# Patient Record
Sex: Male | Born: 1960 | Race: Black or African American | Hispanic: No | Marital: Married | Smoking: Former smoker
Health system: Southern US, Community
[De-identification: ages and names within clinical notes are randomized; demographics above are authoritative.]

## PROBLEM LIST (undated history)

## (undated) DIAGNOSIS — G709 Myoneural disorder, unspecified: Secondary | ICD-10-CM

## (undated) HISTORY — DX: Myoneural disorder, unspecified: G70.9

---

## 1998-11-02 ENCOUNTER — Emergency Department (HOSPITAL_COMMUNITY): Admission: EM | Admit: 1998-11-02 | Discharge: 1998-11-02 | Payer: Self-pay | Admitting: Emergency Medicine

## 2006-02-19 ENCOUNTER — Ambulatory Visit: Payer: Self-pay | Admitting: Endocrinology

## 2006-08-28 ENCOUNTER — Ambulatory Visit: Payer: Self-pay | Admitting: Endocrinology

## 2008-02-05 ENCOUNTER — Ambulatory Visit: Payer: Self-pay | Admitting: Internal Medicine

## 2008-02-05 DIAGNOSIS — M542 Cervicalgia: Secondary | ICD-10-CM

## 2008-02-05 DIAGNOSIS — M79609 Pain in unspecified limb: Secondary | ICD-10-CM

## 2008-02-05 DIAGNOSIS — R5383 Other fatigue: Secondary | ICD-10-CM

## 2008-02-05 DIAGNOSIS — R5381 Other malaise: Secondary | ICD-10-CM

## 2008-02-05 DIAGNOSIS — R209 Unspecified disturbances of skin sensation: Secondary | ICD-10-CM

## 2008-02-05 LAB — CONVERTED CEMR LAB
BUN: 11 mg/dL (ref 6–23)
Chloride: 101 meq/L (ref 96–112)
Eosinophils Absolute: 0.2 10*3/uL (ref 0.0–0.7)
Eosinophils Relative: 4.6 % (ref 0.0–5.0)
GFR calc non Af Amer: 127 mL/min
Glucose, Bld: 111 mg/dL — ABNORMAL HIGH (ref 70–99)
Monocytes Relative: 6.5 % (ref 3.0–12.0)
Neutrophils Relative %: 44.4 % (ref 43.0–77.0)
Platelets: 203 10*3/uL (ref 150–400)
Potassium: 4 meq/L (ref 3.5–5.1)
Sodium: 139 meq/L (ref 135–145)
TSH: 0.52 microintl units/mL (ref 0.35–5.50)
WBC: 3.6 10*3/uL — ABNORMAL LOW (ref 4.5–10.5)

## 2009-01-12 ENCOUNTER — Encounter: Payer: Self-pay | Admitting: Gastroenterology

## 2009-01-31 ENCOUNTER — Ambulatory Visit: Payer: Self-pay | Admitting: Gastroenterology

## 2009-01-31 DIAGNOSIS — R109 Unspecified abdominal pain: Secondary | ICD-10-CM | POA: Insufficient documentation

## 2009-02-01 ENCOUNTER — Ambulatory Visit: Payer: Self-pay | Admitting: Gastroenterology

## 2010-09-05 ENCOUNTER — Other Ambulatory Visit: Payer: Self-pay | Admitting: Family Medicine

## 2010-09-05 DIAGNOSIS — E041 Nontoxic single thyroid nodule: Secondary | ICD-10-CM

## 2011-06-26 ENCOUNTER — Ambulatory Visit (INDEPENDENT_AMBULATORY_CARE_PROVIDER_SITE_OTHER): Payer: 59 | Admitting: Family Medicine

## 2011-06-26 VITALS — BP 124/73 | HR 70 | Temp 97.9°F | Resp 16 | Ht 71.75 in | Wt 191.4 lb

## 2011-06-26 DIAGNOSIS — R200 Anesthesia of skin: Secondary | ICD-10-CM

## 2011-06-26 DIAGNOSIS — M5430 Sciatica, unspecified side: Secondary | ICD-10-CM

## 2011-06-26 DIAGNOSIS — M79609 Pain in unspecified limb: Secondary | ICD-10-CM

## 2011-06-26 DIAGNOSIS — M5432 Sciatica, left side: Secondary | ICD-10-CM

## 2011-06-26 DIAGNOSIS — M79602 Pain in left arm: Secondary | ICD-10-CM

## 2011-06-26 MED ORDER — CYCLOBENZAPRINE HCL 5 MG PO TABS: ORAL_TABLET | ORAL | Status: DC

## 2011-06-26 MED ORDER — PREDNISONE 20 MG PO TABS: ORAL_TABLET | ORAL | Status: DC

## 2011-06-26 MED ORDER — OXAPROZIN 600 MG PO TABS: ORAL_TABLET | ORAL | Status: DC

## 2011-06-26 NOTE — Patient Instructions (Signed)
Take the medicines as ordered. Avoid heavy lifting or straining. If you're not doing better come back in a few weeks for a recheck.

## 2011-06-26 NOTE — Progress Notes (Signed)
Subjective: 75s-year-old [place] male who comes in here complaining of his left leg being numb and painful over the last month. When he stands on it for a while it gets bad. When he just received her rest it does okay. He knows of no injuries. 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 Recent days she's had problem with pain in his left arm and shoulder also. Motor strength is doing good.  Objective: Pleasant gentleman in no acute distress. Cervical motion is good. Shoulders nontender. Arms is grossly normal with good motor strength. Neurovascular intact. Straight leg raising test negative. Good flexion of spine. The tendon reflexes at knee and ankle are normal. Good pedal pulses. Sensory grossly intact.  Assessment: Left lumbar sciatica, intermittent. Left arm pain uncertain etiology  Plan: I do not feel that extensive studies are indicated yet. Will treat with a tapered dose of prednisone and see how you do this, then maintain with some  NSAID. Return if worse. Will give some Flexeril for at bedtime use.

## 2011-08-22 ENCOUNTER — Ambulatory Visit (INDEPENDENT_AMBULATORY_CARE_PROVIDER_SITE_OTHER): Payer: 59 | Admitting: Family Medicine

## 2011-08-22 ENCOUNTER — Ambulatory Visit: Payer: 59

## 2011-08-22 VITALS — BP 126/73 | HR 76 | Temp 98.3°F | Resp 18 | Ht 71.75 in | Wt 195.0 lb

## 2011-08-22 DIAGNOSIS — D649 Anemia, unspecified: Secondary | ICD-10-CM

## 2011-08-22 DIAGNOSIS — R200 Anesthesia of skin: Secondary | ICD-10-CM

## 2011-08-22 DIAGNOSIS — R109 Unspecified abdominal pain: Secondary | ICD-10-CM

## 2011-08-22 DIAGNOSIS — R6881 Early satiety: Secondary | ICD-10-CM

## 2011-08-22 DIAGNOSIS — M549 Dorsalgia, unspecified: Secondary | ICD-10-CM

## 2011-08-22 DIAGNOSIS — R209 Unspecified disturbances of skin sensation: Secondary | ICD-10-CM

## 2011-08-22 LAB — POCT UA - MICROSCOPIC ONLY
Casts, Ur, LPF, POC: NEGATIVE
Crystals, Ur, HPF, POC: NEGATIVE
Mucus, UA: NEGATIVE

## 2011-08-22 LAB — POCT URINALYSIS DIPSTICK
Bilirubin, UA: NEGATIVE
Blood, UA: NEGATIVE
Ketones, UA: NEGATIVE
Leukocytes, UA: NEGATIVE
pH, UA: 7.5

## 2011-08-22 LAB — VITAMIN B12: Vitamin B-12: 319 pg/mL (ref 211–911)

## 2011-08-22 LAB — POCT CBC
Hemoglobin: 11.7 g/dL — AB (ref 14.1–18.1)
Lymph, poc: 1.2 (ref 0.6–3.4)
MCH, POC: 28.8 pg (ref 27–31.2)
MCHC: 33.1 g/dL (ref 31.8–35.4)
MCV: 87.2 fL (ref 80–97)
MPV: 8.8 fL (ref 0–99.8)
POC MID %: 7.3 %M (ref 0–12)
WBC: 2.5 10*3/uL — AB (ref 4.6–10.2)

## 2011-08-22 LAB — COMPREHENSIVE METABOLIC PANEL
ALT: 19 U/L (ref 0–53)
Albumin: 4.6 g/dL (ref 3.5–5.2)
BUN: 16 mg/dL (ref 6–23)
CO2: 29 mEq/L (ref 19–32)
Calcium: 10.1 mg/dL (ref 8.4–10.5)
Chloride: 104 mEq/L (ref 96–112)
Creat: 0.66 mg/dL (ref 0.50–1.35)
Potassium: 4.3 mEq/L (ref 3.5–5.3)

## 2011-08-22 LAB — FOLATE: Folate: 17.2 ng/mL

## 2011-08-22 MED ORDER — DICYCLOMINE HCL 10 MG PO CAPS: ORAL_CAPSULE | ORAL | Status: DC

## 2011-08-22 NOTE — Progress Notes (Signed)
Subjective: he was here 2 months ago with problems with back pain and radiculopathy. We treated him with anti-inflammatory medication, steroids, and muscle accidents. Symptoms have continued to persist. He hurts primarily when he is standing up. It is not bothering him when he is seated. If he stands for about 10 minutes he gets pain as intense as 9/10. The pain radiates from his back to a different places down his legs but usually into the calf with numbness of the foot. No specific injury.  This morning he had abdominal pain. EGD and some fiber beans. He gets feeling gassy and uncomfortable. He is feeling better now. However he gets this from time to time. He takes his bowels move once or twice daily. No problems with them. Sometimes when he eats he gets feeling full very quickly, and having discomfort. He has not been [place] since 2006, and has not seen any parasites in his stools.  Objective: Chest clear to auscultation. Abdomen had normal bowel sounds. Was soft without organomegaly masses or specific tenderness. Normal male external genitalia. No hernias were noted. Digital rectal exam reveals the prostate gland to be moderately large but no nodules. Stool taken for hemasure. Straight leg raising test is negative. Pedal pulse adequate.  Assessment: Back pain with lumbar radiculopathy and foot numbness. Nonspecific abdominal pains  Plan: Check labs Check x-ray of lumbar spine.  Results for orders placed in visit on 08/22/11  POCT CBC      Component Value Range   WBC 2.5 (*) 4.6 - 10.2 (K/uL)   Lymph, poc 1.2  0.6 - 3.4    POC LYMPH PERCENT 49.0  10 - 50 (%L)   MID (cbc) 0.2  0 - 0.9    POC MID % 7.3  0 - 12 (%M)   POC Granulocyte 1.1 (*) 2 - 6.9    Granulocyte percent 43.7  37 - 80 (%G)   RBC 4.06 (*) 4.69 - 6.13 (M/uL)   Hemoglobin 11.7 (*) 14.1 - 18.1 (g/dL)   HCT, POC 09.8 (*) 11.9 - 53.7 (%)   MCV 87.2  80 - 97 (fL)   MCH, POC 28.8  27 - 31.2 (pg)   MCHC 33.1  31.8 - 35.4  (g/dL)   RDW, POC 14.7     Platelet Count, POC 180  142 - 424 (K/uL)   MPV 8.8  0 - 99.8 (fL)  IFOBT (OCCULT BLOOD)      Component Value Range   IFOBT Negative    POCT UA - MICROSCOPIC ONLY      Component Value Range   WBC, Ur, HPF, POC 0-1     RBC, urine, microscopic rare     Bacteria, U Microscopic trace     Mucus, UA neg     Epithelial cells, urine per micros 0-2     Crystals, Ur, HPF, POC neg     Casts, Ur, LPF, POC neg     Yeast, UA neg    POCT URINALYSIS DIPSTICK      Component Value Range   Color, UA yellow     Clarity, UA clear     Glucose, UA neg     Bilirubin, UA neg     Ketones, UA neg     Spec Grav, UA 1.020     Blood, UA neg     pH, UA 7.5     Protein, UA neg     Urobilinogen, UA 0.2     Nitrite, UA neg  Leukocytes, UA Negative    [facility] reading (PRIMARY) by  Dr. Alwyn Ren Normal spine  .

## 2011-08-26 ENCOUNTER — Encounter: Payer: Self-pay | Admitting: Family Medicine

## 2011-08-26 ENCOUNTER — Telehealth: Payer: Self-pay | Admitting: Family Medicine

## 2011-08-26 NOTE — Telephone Encounter (Signed)
LMOM:  MRI okay.  If problems persist we will need to send to a specialist for further eval.  ?neurosurgeon.   I am concerned about his low wbc and rbc.  Asked to return in 1 month for recheck.

## 2011-08-31 ENCOUNTER — Other Ambulatory Visit: Payer: Self-pay

## 2011-08-31 ENCOUNTER — Ambulatory Visit
Admission: RE | Admit: 2011-08-31 | Discharge: 2011-08-31 | Disposition: A | Discharge status: Home / Self Care | Payer: Self-pay | Source: Ambulatory Visit | Attending: Family Medicine | Admitting: Family Medicine

## 2011-08-31 DIAGNOSIS — R200 Anesthesia of skin: Secondary | ICD-10-CM

## 2011-08-31 DIAGNOSIS — M549 Dorsalgia, unspecified: Secondary | ICD-10-CM

## 2011-09-04 ENCOUNTER — Telehealth: Payer: Self-pay | Admitting: Family Medicine

## 2011-09-05 NOTE — Telephone Encounter (Signed)
Pt tried to call Dr Alwyn Ren back, but he was in a room w/a pt. Pt stated that Dr Alwyn Ren can try to reach him back on his cell # [phone], and pt will try to call back as well if Dr Alwyn Ren does not reach him.

## 2011-09-09 ENCOUNTER — Encounter: Payer: Self-pay | Admitting: Family Medicine

## 2011-09-09 ENCOUNTER — Telehealth: Payer: Self-pay | Admitting: Family Medicine

## 2011-09-09 NOTE — Telephone Encounter (Signed)
Left message on machine for patient to return call

## 2011-09-09 NOTE — Telephone Encounter (Signed)
I have been trying to contact his patient by phone to

## 2011-09-09 NOTE — Telephone Encounter (Signed)
I sent him a note today.

## 2011-12-04 ENCOUNTER — Other Ambulatory Visit: Payer: Self-pay | Admitting: Physician Assistant

## 2011-12-04 ENCOUNTER — Ambulatory Visit (INDEPENDENT_AMBULATORY_CARE_PROVIDER_SITE_OTHER): Payer: 59 | Admitting: Physician Assistant

## 2011-12-04 VITALS — BP 130/75 | HR 73 | Temp 97.9°F | Resp 18 | Ht 72.0 in | Wt 187.0 lb

## 2011-12-04 DIAGNOSIS — R519 Headache, unspecified: Secondary | ICD-10-CM

## 2011-12-04 DIAGNOSIS — L03818 Cellulitis of other sites: Secondary | ICD-10-CM

## 2011-12-04 DIAGNOSIS — R51 Headache: Secondary | ICD-10-CM

## 2011-12-04 DIAGNOSIS — L02818 Cutaneous abscess of other sites: Secondary | ICD-10-CM

## 2011-12-04 DIAGNOSIS — L02811 Cutaneous abscess of head [any part, except face]: Secondary | ICD-10-CM

## 2011-12-04 MED ORDER — DOXYCYCLINE HYCLATE 100 MG PO CAPS: 100.0000 mg | ORAL_CAPSULE | Freq: Two times a day (BID) | ORAL | Status: AC

## 2011-12-04 NOTE — Progress Notes (Signed)
  Subjective:    Patient ID: Joshua Kelley, male    DOB: 02-22-1961, 51 y.o.   MRN: [id number]  HPI 51 year old male presents with 10 day history of a bump on the back of his head.  Says he has had several of these that have come up and resolved spontaneously over the last couple weeks.  Admits to some yellow drainage. Denies any warmth. Does have tenderness to touch which is interfering with his sleep due to the location.  No history of similar lesions previous to this.      Review of Systems  All other systems reviewed and are negative.       Objective:   Physical Exam  Constitutional: He is oriented to person, place, and time. He appears well-developed and well-nourished.  HENT:  Head: Normocephalic and atraumatic.  Right Ear: External ear normal.  Left Ear: External ear normal.  Eyes: Conjunctivae are normal.  Neck: Normal range of motion.  Cardiovascular: Normal rate, regular rhythm and normal heart sounds.   Pulmonary/Chest: Effort normal and breath sounds normal.  Neurological: He is alert and oriented to person, place, and time.  Skin:     Psychiatric: He has a normal mood and affect. His behavior is normal. Judgment and thought content normal.     Patient ID: Joshua Kelley MRN: [id number], DOB: [birth year], 51 y.o. Date of Encounter: 12/04/2011, 12:48 PM    PROCEDURE NOTE: Verbal consent obtained. Betadine prep per usual protocol. Local anesthesia obtained with 2% lidocaine with epi.  1 incision made with 11 blade along lesion.  Culture taken. Moderate purulence expressed. Capsule of sebaceous cyst partially debrided.   Lesion explored revealing no loculations. Packed with 1/4 plain packing. Dressed. Wound care instructions including precautions with patient. Patient tolerated the procedure well. Recheck in 24 hours.      Grier Mitts, PA-C 12/04/2011 12:48 PM      Assessment & Plan:   1. Abscess of head  Likely inflamed sebaceous  cyst.  Recheck in 24 hours. Recommend warm compresses. Wound culture, doxycycline (VIBRAMYCIN) 100 MG capsule  2. Pain in head

## 2011-12-05 ENCOUNTER — Ambulatory Visit (INDEPENDENT_AMBULATORY_CARE_PROVIDER_SITE_OTHER): Payer: 59 | Admitting: Physician Assistant

## 2011-12-05 VITALS — BP 133/74 | HR 89 | Temp 98.6°F | Resp 16 | Wt 190.0 lb

## 2011-12-05 DIAGNOSIS — R51 Headache: Secondary | ICD-10-CM

## 2011-12-05 DIAGNOSIS — L02818 Cutaneous abscess of other sites: Secondary | ICD-10-CM

## 2011-12-05 DIAGNOSIS — L089 Local infection of the skin and subcutaneous tissue, unspecified: Secondary | ICD-10-CM

## 2011-12-05 NOTE — Patient Instructions (Addendum)
Continue the antibiotic. Apply a warm compress to the area for 20 minutes 2-3 times daily. Return in 2 days for additional care, but come back tomorrow if the packing falls out again.

## 2011-12-05 NOTE — Progress Notes (Signed)
  Subjective:    Patient ID: MONT JAGODA, male    DOB: 03-17-1961, 51 y.o.   MRN: [id number]  HPI This 51 y.o. male presents for evaluation of an infected sebaceous cyst on the posterior scalp, s/p I&D yesterday.  Tolerating Doxycycline.  No fever. Less painful.  He has applied antibiotic cream to two other lesions that did not require I&D yesterday.  The packing placed in the primary lesion came out when he changed clothes.   Review of Systems No GI/GU symptoms.  No new lesions.    Objective:   Physical Exam  Blood pressure 133/74, pulse 89, temperature 98.6 F (37 C), resp. rate 16, weight 190 lb (86.183 kg). There is no height on file to calculate BMI. Well-developed, well nourished [place] man who is awake, alert and oriented, in NAD. HEENT: [place]/AT Neck: supple, non-tender, no lymphadenopathy, thyromegaly. Lungs: normal effort Skin: Three lesions on the posterior scalp.  The 2 smaller lesions are crusted, and when the crust is removed purulence is expressed.  They are cleansed and antibiotic ointment applied.  The larger lesion, s/p I&D, has a large amount of thick yellow eschar, and what appears to be cyst sac, some of which is debrided.  Remaining material is very adherent to the surrounding tissue.  The wound cavity irrigated with 3 cc 2% lidocaine plain and packed with 1/4 inch packing.  Bandage applied.     Assessment & Plan:   1. Cellulitis and abscess of other specified site   2. Infected sebaceous cyst   3. Headache    Patient Instructions  Continue the antibiotic. Apply a warm compress to the area for 20 minutes 2-3 times daily. Return in 2 days for additional care, but come back tomorrow if the packing falls out again.

## 2011-12-06 ENCOUNTER — Encounter: Payer: Self-pay | Admitting: Physician Assistant

## 2011-12-06 ENCOUNTER — Ambulatory Visit (INDEPENDENT_AMBULATORY_CARE_PROVIDER_SITE_OTHER): Payer: 59 | Admitting: Physician Assistant

## 2011-12-06 VITALS — BP 140/86 | HR 76 | Temp 98.3°F | Resp 16 | Ht 71.5 in | Wt 187.0 lb

## 2011-12-06 DIAGNOSIS — L03818 Cellulitis of other sites: Secondary | ICD-10-CM

## 2011-12-06 DIAGNOSIS — L02818 Cutaneous abscess of other sites: Secondary | ICD-10-CM

## 2011-12-06 DIAGNOSIS — L723 Sebaceous cyst: Secondary | ICD-10-CM

## 2011-12-06 NOTE — Progress Notes (Signed)
  Subjective:    Patient ID: Joshua Kelley, male    DOB: 11-04-1960, 51 y.o.   MRN: [id number]  HPI  This 51 y.o. male presents for evaluation of wounds on the posterior scalp, s/p I&D on 12/04/2011.  He reports considerable decrease in discomfort, and lots of drainage overnight.  There is now a 4th lesion draining.  He is applying antibiotic cream to the 3 lesions not packed.  Tolerating medication.  No fever.  Review of Systems As above.    Objective:   Physical Exam  Blood pressure 140/86, pulse 76, temperature 98.3 F (36.8 C), temperature source Oral, resp. rate 16, height 5' 11.5" (1.816 m), weight 187 lb (84.823 kg). Body mass index is 25.72 kg/(m^2). Well-developed, well nourished [place] man who is awake, alert and oriented, in NAD. Posterior scalp notable for 4 crusted lesions.  The largest, s/p I&D, has packing in place.  Packing removed.  No purulence, but there is remaining cyst sac wall very adherent to the base of the wound.  Small portions of it are removed with pick ups.  The wound was repacked with 1/4 inch plain packing. The three smaller wounds are cleansed, crusts removed, and purulence expressed.  The lesion closest to the largest lesion had some yellow eschar, and when removed, left an opening large enough to pack with a scant amount of packing. The two non-packed lesions are dressed with mupirocin ointment.  All lesions dressed with gauze.     Assessment & Plan:   1. Cellulitis and abscess of other specified site   2. Sebaceous cyst    Patient Instructions  Continue the antibiotic and application of warm compresses to the sites for 20 minutes 2-3 times daily. Return for recheck on Sunday 8a-6p.  If you cannot return on Sunday, return on Monday 8a-8p.

## 2011-12-06 NOTE — Patient Instructions (Signed)
Continue the antibiotic and application of warm compresses to the sites for 20 minutes 2-3 times daily. Return for recheck on Sunday 8a-6p.  If you cannot return on Sunday, return on Monday 8a-8p.

## 2011-12-07 LAB — WOUND CULTURE: Gram Stain: NONE SEEN

## 2011-12-08 ENCOUNTER — Ambulatory Visit (INDEPENDENT_AMBULATORY_CARE_PROVIDER_SITE_OTHER): Payer: 59 | Admitting: Physician Assistant

## 2011-12-08 VITALS — BP 116/73 | HR 80 | Temp 98.0°F | Resp 16 | Ht 72.0 in | Wt 190.0 lb

## 2011-12-08 DIAGNOSIS — L02811 Cutaneous abscess of head [any part, except face]: Secondary | ICD-10-CM

## 2011-12-08 DIAGNOSIS — L02818 Cutaneous abscess of other sites: Secondary | ICD-10-CM

## 2011-12-08 NOTE — Progress Notes (Signed)
Patient ID: Joshua Kelley MRN: [id number], DOB: [birth year] 51 y.o. Date of Encounter: 12/08/2011, 4:51 PM  Chief Complaint: Wound care   See previous note  HPI: 51 y.o. y/o male presents for wound care s/p I&D on 12/04/11. Doing well No issues or complaints Afebrile/ no chills No nausea or vomiting Tolerating doxycycline. Pain controlled.  Daily dressing change Previous note reviewed  No past medical history on file.   Home Meds: Prior to Admission medications   Medication Sig Start Date End Date Taking? Authorizing Provider  doxycycline (VIBRAMYCIN) 100 MG capsule Take 1 capsule (100 mg total) by mouth 2 (two) times daily. 12/04/11 12/14/11 Yes Demi Trieu Jaquita Rector, PA-C  dicyclomine (BENTYL) 10 MG capsule Take one tablet 3 times daily 08/22/11   Peyton Najjar, MD  oxaprozin (DAYPRO) 600 MG tablet Take one twice daily at breakfast and supper for pain and inflammation. 06/26/11   Peyton Najjar, MD  predniSONE (DELTASONE) 20 MG tablet Take 3 daily for 2 days, then 2 daily for 2 days, then one daily for 2 days for sciatica 06/26/11   Peyton Najjar, MD    Allergies: No Known Allergies  ROS: Constitutional: Afebrile, no chills Cardiovascular: negative for chest pain or palpitations Dermatological: Positive for wound. Negative for erythema, pain, or warmth.  GI: No nausea or vomiting   EXAM: Physical Exam: Blood pressure 116/73, pulse 80, temperature 98 F (36.7 C), temperature source Oral, resp. rate 16, height 6' (1.829 m), weight 190 lb (86.183 kg), SpO2 99.00%., Body mass index is 25.77 kg/(m^2). General: Well developed, well nourished, in no acute distress. Nontoxic appearing. Head: Normocephalic, atraumatic, sclera non-icteric.  Neck: Supple. Lungs: Breathing is unlabored. Heart: Normal rate. Skin:  Warm and moist. Dressing and packing in place. No induration, erythema, or tenderness to palpation. Neuro: Alert and oriented X 3. Moves all extremities spontaneously. Normal  gait.  Psych:  Responds to questions appropriately with a normal affect.       PROCEDURE: Dressing and packing removed. No purulence expressed. Minimal purulence on packing and bandage.  Wound bed healthy Irrigated with 2% plain lidocaine 5 cc. Repacked with 1/4" plain. Dressing applied  LAB: Culture:   A/P: 51 y.o. y/o male with scalp cellulitis/abscess as above s/p I&D on 12/04/11 Wound care per above Continue doxycycline. Pain well controlled Daily dressing changes Recheck 48 hours. If unable to return on 8/13, will return on 8/15 on his day off. Will advance packing daily.  Warm compresses to other affected areas on the scalp.   Grier Mitts, PA-C 12/08/2011 4:51 PM

## 2011-12-10 ENCOUNTER — Ambulatory Visit (INDEPENDENT_AMBULATORY_CARE_PROVIDER_SITE_OTHER): Payer: 59 | Admitting: Physician Assistant

## 2011-12-10 VITALS — BP 132/78 | HR 76 | Temp 98.3°F | Resp 18 | Ht 72.0 in | Wt 190.0 lb

## 2011-12-10 DIAGNOSIS — L02818 Cutaneous abscess of other sites: Secondary | ICD-10-CM

## 2011-12-10 DIAGNOSIS — L02811 Cutaneous abscess of head [any part, except face]: Secondary | ICD-10-CM

## 2011-12-10 NOTE — Progress Notes (Signed)
  Subjective:    Patient ID: Joshua Kelley, male    DOB: 10-14-1960, 51 y.o.   MRN: [id number]  HPI Pt. here for abscess recheck. Doing well, much less pain.  Tolerating antibiotics well.  Review of Systems  All other systems reviewed and are negative.       Objective:   Physical Exam  Nursing note and vitals reviewed. Constitutional: He is oriented to person, place, and time. He appears well-developed and well-nourished.  Cardiovascular: Normal rate.   Pulmonary/Chest: Effort normal.  Neurological: He is alert and oriented to person, place, and time.  Skin:       Scalp-packing removed.  Incision site is clean and dry without active draining or erythema..       Assessment & Plan:  Abscess recheck s/p I&D 12/08/2011.  Continue antibiotics and wound care.  Recheck prn

## 2012-01-23 ENCOUNTER — Ambulatory Visit (INDEPENDENT_AMBULATORY_CARE_PROVIDER_SITE_OTHER): Payer: 59 | Admitting: Emergency Medicine

## 2012-01-23 VITALS — BP 124/76 | HR 101 | Temp 98.3°F | Resp 17 | Ht 72.0 in | Wt 194.0 lb

## 2012-01-23 DIAGNOSIS — J40 Bronchitis, not specified as acute or chronic: Secondary | ICD-10-CM

## 2012-01-23 DIAGNOSIS — J018 Other acute sinusitis: Secondary | ICD-10-CM

## 2012-01-23 MED ORDER — PSEUDOEPHEDRINE-GUAIFENESIN ER 60-600 MG PO TB12: 1.0000 | ORAL_TABLET | Freq: Two times a day (BID) | ORAL | Status: AC

## 2012-01-23 MED ORDER — AMOXICILLIN-POT CLAVULANATE 875-125 MG PO TABS: 1.0000 | ORAL_TABLET | Freq: Two times a day (BID) | ORAL | Status: DC

## 2012-01-23 MED ORDER — HYDROCOD POLST-CHLORPHEN POLST 10-8 MG/5ML PO LQCR: 5.0000 mL | Freq: Two times a day (BID) | ORAL | Status: DC | PRN

## 2012-01-23 NOTE — Progress Notes (Signed)
[facility] [address] [place] [address] [phone]  Date:  01/23/2012   Name:  Joshua Kelley   DOB:  02/04/1961   MRN:  [id number]  PCP:  Oliver Barre, MD    Chief Complaint: Cough and Nasal Congestion   History of Present Illness:  Joshua Kelley is a 51 y.o. very pleasant male patient who presents with the following:  Ill for two weeks with green nasal drainage and post nasal drip.  Has a cough that is productive small amount green sputum. No fever or chills.  No shortness of breath or wheezing.  Myalgias, malaise, and fatigue.  Has paroxysms of coughing.  Denies improvement with OTC medication.  Child ill with similar symptoms.    Patient Active Problem List  Diagnosis  . CERVICALGIA  . HEEL PAIN, BILATERAL  . FATIGUE  . PARESTHESIA, HANDS  . ABDOMINAL PAIN OTHER SPECIFIED SITE    No past medical history on file.  No past surgical history on file.  History  Substance Use Topics  . Smoking status: Former Games developer  . Smokeless tobacco: Not on file  . Alcohol Use: Not on file    No family history on file.  No Known Allergies  Medication list has been reviewed and updated.  Current Outpatient Prescriptions on File Prior to Visit  Medication Sig Dispense Refill  . dicyclomine (BENTYL) 10 MG capsule Take one tablet 3 times daily  30 capsule  1  . oxaprozin (DAYPRO) 600 MG tablet Take one twice daily at breakfast and supper for pain and inflammation.  30 tablet  1  . predniSONE (DELTASONE) 20 MG tablet Take 3 daily for 2 days, then 2 daily for 2 days, then one daily for 2 days for sciatica  12 tablet  0    Review of Systems:  As per HPI, otherwise negative.    Physical Examination: Filed Vitals:   01/23/12 1702  BP: 124/76  Pulse: 101  Temp: 98.3 F (36.8 C)  Resp: 17   Filed Vitals:   01/23/12 1702  Height: 6' (1.829 m)  Weight: 194 lb (87.998 kg)   Body mass index is 26.31 kg/(m^2). Ideal Body Weight: Weight  in (lb) to have BMI = 25: 183.9   GEN: WDWN, NAD, Non-toxic, A & O x 3  No rash, sepsis or respiratory distress. HEENT: Atraumatic, Normocephalic. Neck supple. No masses, No LAD.  Oropharynx negative Ears and Nose: No external deformity. Nasal mucosa pallid with green nasal drainage CV: RRR, No M/G/R. No JVD. No thrill. No extra heart sounds. PULM: CTA B, no wheezes, crackles, rhonchi. No retractions. No resp. distress. No accessory muscle use. ABD: S, NT, ND, +BS. No rebound. No HSM. EXTR: No c/c/e NEURO Normal gait.  PSYCH: Normally interactive. Conversant. Not depressed or anxious appearing.  Calm demeanor.    Assessment and Plan: 1. Other acute sinusitis   2. Bronchitis, not specified as acute or chronic    Tussionex augmentin mucinex Follow up as needed. Increase fluids    Carmelina Dane, MD  I have reviewed and agree with documentation. Robert P. Merla Riches, M.D.

## 2012-07-06 ENCOUNTER — Ambulatory Visit: Payer: 59

## 2012-07-08 ENCOUNTER — Ambulatory Visit (INDEPENDENT_AMBULATORY_CARE_PROVIDER_SITE_OTHER): Payer: 59 | Admitting: Emergency Medicine

## 2012-07-08 VITALS — BP 112/80 | HR 79 | Temp 98.4°F | Resp 18 | Ht 70.0 in | Wt 194.0 lb

## 2012-07-08 DIAGNOSIS — L0293 Carbuncle, unspecified: Secondary | ICD-10-CM

## 2012-07-08 DIAGNOSIS — H00019 Hordeolum externum unspecified eye, unspecified eyelid: Secondary | ICD-10-CM

## 2012-07-08 DIAGNOSIS — H00016 Hordeolum externum left eye, unspecified eyelid: Secondary | ICD-10-CM

## 2012-07-08 DIAGNOSIS — L0213 Carbuncle of neck: Secondary | ICD-10-CM

## 2012-07-08 DIAGNOSIS — L0212 Furuncle of neck: Secondary | ICD-10-CM

## 2012-07-08 MED ORDER — MUPIROCIN 2 % EX OINT: TOPICAL_OINTMENT | CUTANEOUS | Status: DC

## 2012-07-08 MED ORDER — DOXYCYCLINE HYCLATE 100 MG PO CAPS: 100.0000 mg | ORAL_CAPSULE | Freq: Two times a day (BID) | ORAL | Status: DC

## 2012-07-08 MED ORDER — TOBRAMYCIN 0.3 % OP OINT: TOPICAL_OINTMENT | Freq: Four times a day (QID) | OPHTHALMIC | Status: DC

## 2012-07-08 NOTE — Progress Notes (Addendum)
  Subjective:    Patient ID: Joshua Kelley, male    DOB: 22-Mar-1961, 52 y.o.   MRN: [id number]  HPI presents today with 1)small spots/bumps on back of head that comes and goes. It is itchy.  2) stye L lower lid    Review of Systems     Objective:   Physical Exam there are multiple raised red bumps over the back of the neck. Examination of left is normal except for a 4 x 5 mm stye present on the lower lid laterally.        Assessment & Plan:  We'll treat with doxycycline for 10 days and also use tobramycin ointment for the sty involving the left lower lid

## 2012-07-11 LAB — WOUND CULTURE
Gram Stain: NONE SEEN
Gram Stain: NONE SEEN

## 2014-04-12 ENCOUNTER — Ambulatory Visit (INDEPENDENT_AMBULATORY_CARE_PROVIDER_SITE_OTHER): Payer: 59 | Admitting: Family Medicine

## 2014-04-12 VITALS — BP 120/78 | HR 69 | Temp 98.6°F | Resp 20 | Ht 71.0 in | Wt 190.1 lb

## 2014-04-12 DIAGNOSIS — R1031 Right lower quadrant pain: Secondary | ICD-10-CM

## 2014-04-12 DIAGNOSIS — M545 Low back pain, unspecified: Secondary | ICD-10-CM

## 2014-04-12 DIAGNOSIS — G8929 Other chronic pain: Secondary | ICD-10-CM

## 2014-04-12 LAB — POCT UA - MICROSCOPIC ONLY
Bacteria, U Microscopic: NEGATIVE
Casts, Ur, LPF, POC: NEGATIVE
Crystals, Ur, HPF, POC: NEGATIVE
Epithelial cells, urine per micros: NEGATIVE
Mucus, UA: NEGATIVE
RBC, urine, microscopic: NEGATIVE
WBC, Ur, HPF, POC: NEGATIVE
Yeast, UA: NEGATIVE

## 2014-04-12 LAB — POCT CBC
Granulocyte percent: 39.4 %G (ref 37–80)
HCT, POC: 41.3 % — AB (ref 43.5–53.7)
Hemoglobin: 13.7 g/dL — AB (ref 14.1–18.1)
Lymph, poc: 1.9 (ref 0.6–3.4)
MCH, POC: 29.1 pg (ref 27–31.2)
MCHC: 33 g/dL (ref 31.8–35.4)
MCV: 88.1 fL (ref 80–97)
MID (cbc): 0.2 (ref 0–0.9)
MPV: 7.2 fL (ref 0–99.8)
POC Granulocyte: 1.3 — AB (ref 2–6.9)
POC LYMPH PERCENT: 56.1 %L — AB (ref 10–50)
POC MID %: 4.5 %M (ref 0–12)
Platelet Count, POC: 208 10*3/uL (ref 142–424)
RBC: 4.69 M/uL (ref 4.69–6.13)
RDW, POC: 12.8 %
WBC: 3.4 10*3/uL — AB (ref 4.6–10.2)

## 2014-04-12 LAB — POCT URINALYSIS DIPSTICK
Bilirubin, UA: NEGATIVE
Blood, UA: NEGATIVE
Glucose, UA: NEGATIVE
Ketones, UA: NEGATIVE
Leukocytes, UA: NEGATIVE
Nitrite, UA: NEGATIVE
Protein, UA: NEGATIVE
Spec Grav, UA: 1.025
Urobilinogen, UA: 0.2
pH, UA: 5

## 2014-04-12 MED ORDER — DICLOFENAC SODIUM 75 MG PO TBEC: 75.0000 mg | DELAYED_RELEASE_TABLET | Freq: Two times a day (BID) | ORAL | Status: DC

## 2014-04-12 MED ORDER — DOXYCYCLINE HYCLATE 100 MG PO TABS: 100.0000 mg | ORAL_TABLET | Freq: Two times a day (BID) | ORAL | Status: DC

## 2014-04-12 NOTE — Progress Notes (Signed)
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 He gives a rather vague history of intermittent but persistent bilateral lumbar pain with some radiation to right lower quadrant of his abdomen. He says that he gets a little bit nauseated when he eats.  He also notes that he's been going to bathroom more frequently but has no burning or fever.  Patient also denies vomiting   objective: No acute distress HEENT: Unremarkable Neck: Supple no adenopathy and full range of motion Chest: Clear Abdomen: Soft nontender without HSM or masses Patient has full range of motion of his hips, knees, ankles. Straight leg raising is negative. His pelvic rotation is number normal. Is no loss of muscle mass. Coordination is normal and lower extremities.  Results for orders placed or performed in visit on 04/12/14  POCT urinalysis dipstick  Result Value Ref Range   Color, UA yellow    Clarity, UA clear    Glucose, UA neg    Bilirubin, UA neg    Ketones, UA neg    Spec Grav, UA 1.025    Blood, UA neg    pH, UA 5.0    Protein, UA neg    Urobilinogen, UA 0.2    Nitrite, UA neg    Leukocytes, UA Negative   POCT UA - Microscopic Only  Result Value Ref Range   WBC, Ur, HPF, POC neg    RBC, urine, microscopic neg    Bacteria, U Microscopic neg    Mucus, UA neg    Epithelial cells, urine per micros neg    Crystals, Ur, HPF, POC neg    Casts, Ur, LPF, POC neg    Yeast, UA neg   POCT CBC  Result Value Ref Range   WBC 3.4 (A) 4.6 - 10.2 K/uL   Lymph, poc 1.9 0.6 - 3.4   POC LYMPH PERCENT 56.1 (A) 10 - 50 %L   MID (cbc) 0.2 0 - 0.9   POC MID % 4.5 0 - 12 %M   POC Granulocyte 1.3 (A) 2 - 6.9   Granulocyte percent 39.4 37 - 80 %G   RBC 4.69 4.69 - 6.13 M/uL   Hemoglobin 13.7 (A) 14.1 - 18.1 g/dL   HCT, POC 16.141.3 (A) 09.643.5 - 53.7 %   MCV 88.1 80 - 97 fL   MCH, POC 29.1 27 - 31.2 pg   MCHC 33.0 31.8 - 35.4 g/dL   RDW, POC 04.512.8 %   Platelet Count, POC 208 142 - 424 K/uL   MPV 7.2 0 - 99.8 fL    Assessment:  Symptoms are consistent with either prostate infection, viral infection, or simple low back pain.  Plan: The diclofenac and doxycycline     ICD-9-CM ICD-10-CM   1. Bilateral low back pain without sciatica 724.2 M54.5 POCT urinalysis dipstick     POCT UA - Microscopic Only     Comprehensive metabolic panel     POCT CBC     diclofenac (VOLTAREN) 75 MG EC tablet     doxycycline (VIBRA-TABS) 100 MG tablet  2. Abdominal pain, chronic, right lower quadrant 789.03 R10.31 POCT urinalysis dipstick   338.29 G89.29 POCT UA - Microscopic Only     Comprehensive metabolic panel     POCT CBC     diclofenac (VOLTAREN) 75 MG EC tablet     doxycycline (VIBRA-TABS) 100 MG tablet   Bilateral low back pain without sciatica - Plan: POCT urinalysis dipstick, POCT UA - Microscopic Only, Comprehensive metabolic panel, POCT CBC, diclofenac (  VOLTAREN) 75 MG EC tablet, doxycycline (VIBRA-TABS) 100 MG tablet  Abdominal pain, chronic, right lower quadrant - Plan: POCT urinalysis dipstick, POCT UA - Microscopic Only, Comprehensive metabolic panel, POCT CBC, diclofenac (VOLTAREN) 75 MG EC tablet, doxycycline (VIBRA-TABS) 100 MG tablet    Signed, Elvina SidleKurt Ayodeji Keimig, MD .

## 2014-04-13 LAB — COMPREHENSIVE METABOLIC PANEL WITH GFR
ALT: 15 U/L (ref 0–53)
AST: 17 U/L (ref 0–37)
Albumin: 4.5 g/dL (ref 3.5–5.2)
Alkaline Phosphatase: 101 U/L (ref 39–117)
BUN: 13 mg/dL (ref 6–23)
CO2: 32 meq/L (ref 19–32)
Calcium: 9.6 mg/dL (ref 8.4–10.5)
Chloride: 100 meq/L (ref 96–112)
Creat: 0.86 mg/dL (ref 0.50–1.35)
Glucose, Bld: 103 mg/dL — ABNORMAL HIGH (ref 70–99)
Potassium: 4.2 meq/L (ref 3.5–5.3)
Sodium: 138 meq/L (ref 135–145)
Total Bilirubin: 0.4 mg/dL (ref 0.2–1.2)
Total Protein: 6.9 g/dL (ref 6.0–8.3)

## 2014-05-19 ENCOUNTER — Ambulatory Visit (INDEPENDENT_AMBULATORY_CARE_PROVIDER_SITE_OTHER): Payer: 59 | Admitting: Emergency Medicine

## 2014-05-19 VITALS — BP 128/88 | HR 72 | Temp 98.4°F | Resp 18 | Wt 197.0 lb

## 2014-05-19 DIAGNOSIS — L0211 Cutaneous abscess of neck: Secondary | ICD-10-CM

## 2014-05-19 MED ORDER — SULFAMETHOXAZOLE-TRIMETHOPRIM 800-160 MG PO TABS: 1.0000 | ORAL_TABLET | Freq: Two times a day (BID) | ORAL | Status: DC

## 2014-05-19 NOTE — Progress Notes (Signed)
[facility] [address], [place] [id number] [phone]  Date:  05/19/2014   Name:  Joshua Kelley   DOB:  11/11/1960   MRN:  [id number]  PCP:  Oliver BarreJames John, MD    Chief Complaint: "bump under chin"   History of Present Illness:  Joshua Kelley is a 54 y.o. 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 Some purulent drainage No fever or chills History of similar. No improvement with over the counter medications or other home remedies.  Denies other complaint or health concern today.   Patient Active Problem List   Diagnosis Date Noted  . ABDOMINAL PAIN OTHER SPECIFIED SITE 01/31/2009  . CERVICALGIA 02/05/2008  . HEEL PAIN, BILATERAL 02/05/2008  . FATIGUE 02/05/2008  . PARESTHESIA, HANDS 02/05/2008    Past Medical History  Diagnosis Date  . Neuromuscular disorder     No past surgical history on file.  History  Substance Use Topics  . Smoking status: Former Games developermoker  . Smokeless tobacco: Never Used  . Alcohol Use: No    Family History  Problem Relation Age of Onset  . Diabetes Father     No Known Allergies  Medication list has been reviewed and updated.  No current outpatient prescriptions on file prior to visit.   No current facility-administered medications on file prior to visit.    Review of Systems:  As per HPI, otherwise negative.    Physical Examination: Filed Vitals:   05/19/14 1819  BP: 128/88  Pulse: 72  Temp: 98.4 F (36.9 C)  Resp: 18   Filed Vitals:   05/19/14 1819  Weight: 197 lb (89.359 kg)   Body mass index is 27.49 kg/(m^2). Ideal Body Weight:     GEN: WDWN, NAD, Non-toxic, Alert & Oriented x 3 HEENT: Atraumatic, Normocephalic.  Ears and Nose: No external deformity. EXTR: No clubbing/cyanosis/edema NEURO: Normal gait.  PSYCH: Normally interactive. Conversant. Not depressed or anxious appearing.  Calm demeanor.  Marble  sized draining cutaneous mass. No cellulitis  Assessment and Plan: Abscess Septra  Signed,  Phillips OdorJeffery Jaquann Guarisco, MD

## 2014-05-19 NOTE — Patient Instructions (Signed)

## 2015-04-03 ENCOUNTER — Ambulatory Visit (INDEPENDENT_AMBULATORY_CARE_PROVIDER_SITE_OTHER): Payer: BLUE CROSS/BLUE SHIELD

## 2015-04-03 ENCOUNTER — Ambulatory Visit (INDEPENDENT_AMBULATORY_CARE_PROVIDER_SITE_OTHER): Payer: BLUE CROSS/BLUE SHIELD | Admitting: Emergency Medicine

## 2015-04-03 VITALS — BP 110/82 | HR 79 | Temp 98.4°F | Resp 18 | Ht 71.0 in | Wt 191.0 lb

## 2015-04-03 DIAGNOSIS — M25512 Pain in left shoulder: Secondary | ICD-10-CM

## 2015-04-03 DIAGNOSIS — M5410 Radiculopathy, site unspecified: Secondary | ICD-10-CM | POA: Diagnosis not present

## 2015-04-03 DIAGNOSIS — R079 Chest pain, unspecified: Secondary | ICD-10-CM

## 2015-04-03 MED ORDER — GABAPENTIN 300 MG PO CAPS: ORAL_CAPSULE | ORAL | Status: DC

## 2015-04-03 MED ORDER — PREDNISONE 10 MG PO TABS: ORAL_TABLET | ORAL | Status: DC

## 2015-04-03 NOTE — Progress Notes (Signed)
This chart was scribed for Lesle Chris, MD by Joshua Ore, Medical Scribe. This patient was seen in Room 5 and the patient's care was started 8:51 AM.  Chief Complaint:  Chief Complaint  Patient presents with  . Back Pain    upper back pain that spreads to the arms. x10 days No known injuries.     HPI: Joshua Kelley is a 54 y.o. male who reports to [facility] today complaining of gradual onset upper back pain that has radiated down to his left arm that started 10 days ago. He states that he first noticed it when it was cold outside and heat was on high inside. He's tried rubbing a balm on his arms without resolve. He denies any known injuries to the area. He denies neck stiffness, neck pain.   He drives a truck for work.   Past Medical History  Diagnosis Date  . Neuromuscular disorder (HCC)    History reviewed. No pertinent past surgical history. Social History   Social History  . Marital Status: Married    Spouse Name: N/A  . Number of Children: N/A  . Years of Education: N/A   Social History Main Topics  . Smoking status: Former Games developer  . Smokeless tobacco: Never Used  . Alcohol Use: No  . Drug Use: No  . Sexual Activity: Not Asked   Other Topics Concern  . None   Social History Narrative   Family History  Problem Relation Age of Onset  . Diabetes Father    No Known Allergies Prior to Admission medications   Medication Sig Start Date End Date Taking? Authorizing Provider  sulfamethoxazole-trimethoprim (BACTRIM DS,SEPTRA DS) 800-160 MG per tablet Take 1 tablet by mouth 2 (two) times daily. Patient not taking: Reported on 04/03/2015 05/19/14   Carmelina Dane, MD     ROS:  Constitutional: negative for fever, chills, night sweats, weight changes, or fatigue  HEENT: negative for vision changes, hearing loss, congestion, rhinorrhea, ST, epistaxis, or sinus pressure Cardiovascular: negative for chest pain or palpitations Respiratory: negative for  hemoptysis, wheezing, shortness of breath, or cough Abdominal: negative for abdominal pain, nausea, vomiting, diarrhea, or constipation Dermatological: negative for rash Neurologic: negative for headache, dizziness, or syncope Musc: positive for back pain, myalgia (left arm)  negative for neck pain, neck stiffness All other systems reviewed and are otherwise negative with the exception to those above and in the HPI.  PHYSICAL EXAM: Filed Vitals:   04/03/15 0846  BP: 110/82  Pulse: 79  Temp: 98.4 F (36.9 C)  Resp: 18   Body mass index is 26.65 kg/(m^2).   General: Alert and cooperative HEENT:  Normocephalic, atraumatic, oropharynx patent. Eye: EOMI, [facility] Neck: thyroid nl, good ROM neck Cardiovascular:  Regular rate and rhythm, no rubs murmurs or gallops.  No Carotid bruits, radial pulse intact. No pedal edema.  Respiratory: Clear to auscultation bilaterally.  No wheezes, rales, or rhonchi.  No cyanosis, no use of accessory musculature Abdominal: No organomegaly, abdomen is soft and non-tender, positive bowel sounds. No masses. Musculoskeletal: Gait intact. tenderness along the left medial scapular border and left posterior chest Skin: No rashes. Neurologic: Facial musculature symmetric.; 2+ biceps but trace to no brachioradialis reflexes Psychiatric: Patient acts appropriately throughout our interaction.  Lymphatic: No cervical or submandibular lymphadenopathy Genitourinary/Anorectal: No acute findings   LABS:   EKG/XRAY:   Primary read interpreted by Dr. Cleta Alberts at [facility]: chest xray: no acute disease, no pneumothorax, chest normal c-spine xray: mild degenerative  changes at c4 and c5, mild foraminal narrowing on the left at c5; please comment on calcification at AP view at C3 Left shoulder xray: normal   ASSESSMENT/PLAN: There was an unusual calcific density that appear benign adjacent to C3. Awaiting radiologist review this. This was on the right. We'll proceed with  treatment with a tapered dose of prednisone along with Neurontin at night. 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 Recheck 1 week if symptoms persist.  By signing my name below, I, Joshua Kelley, attest that this documentation has been prepared under the direction and in the presence of Lesle ChrisSteven Brihanna Devenport, MD. Electronically Signed: Stann Oresung-Kai Kelley, Scribe. 04/03/2015 , 8:51 AM .    Michaell CowingGross sideeffects, risk and benefits, and alternatives of medications d/w patient. Patient is aware that all medications have potential sideeffects and we are unable to predict every sideeffect or drug-drug interaction that may occur.  Lesle ChrisSteven Royelle Hinchman MD 04/03/2015 8:51 AM

## 2015-04-17 ENCOUNTER — Ambulatory Visit (INDEPENDENT_AMBULATORY_CARE_PROVIDER_SITE_OTHER): Payer: BLUE CROSS/BLUE SHIELD | Admitting: Emergency Medicine

## 2015-04-17 VITALS — BP 130/82 | HR 81 | Temp 98.8°F | Resp 18 | Ht 71.0 in | Wt 193.0 lb

## 2015-04-17 DIAGNOSIS — M25512 Pain in left shoulder: Secondary | ICD-10-CM

## 2015-04-17 DIAGNOSIS — R079 Chest pain, unspecified: Secondary | ICD-10-CM | POA: Diagnosis not present

## 2015-04-17 DIAGNOSIS — M5410 Radiculopathy, site unspecified: Secondary | ICD-10-CM | POA: Diagnosis not present

## 2015-04-17 MED ORDER — GABAPENTIN 300 MG PO CAPS: ORAL_CAPSULE | ORAL | Status: AC

## 2015-04-17 MED ORDER — MELOXICAM 15 MG PO TABS: 15.0000 mg | ORAL_TABLET | Freq: Every day | ORAL | Status: DC

## 2015-04-17 NOTE — Progress Notes (Signed)
Patient ID: Joshua BorosSherefeldein M Standing, male   DOB: 11/23/1960, 54 y.o.   MRN: [id number]    By signing my name below, I, Essence Howell, attest that this documentation has been prepared under the direction and in the presence of Collene GobbleSteven A Mayrin Schmuck, MD Electronically Signed: Charline BillsEssence Howell, ED Scribe 04/17/2015 at 9:07 AM.  Chief Complaint:  Chief Complaint  Patient presents with  . Follow-up    no better  . Shoulder Pain  . Elbow Pain  . Chest Pain    left side of chest    HPI: Joshua Kelley is a 54 y.o. male who [facility] for a follow-up regarding persistent upper back pain. Pt was seen in the office on 04/03/15 regarding upper back pain that radiates into left arm x10 days prior. 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 Pt was discharged with Prednisone and Neurontin which he states has provided minimal relief for 1 week but states that pt has returned. Pt reports persistent pain that radiates from his left shoulder into left upper arm that is exacerbated with movement. Pain is not reproducible with palpation or turning his neck. 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 Pt denies chest pain and cough.  Past Medical History  Diagnosis Date  . Neuromuscular disorder (HCC)    History reviewed. No pertinent past surgical history. Social History   Social History  . Marital Status: Married    Spouse Name: N/A  . Number of Children: N/A  . Years of Education: N/A   Social History Main Topics  . Smoking status: Former Games developermoker  . Smokeless tobacco: Never Used  . Alcohol Use: No  . Drug Use: No  . Sexual Activity: Not Asked   Other Topics Concern  . None   Social History Narrative   Family History  Problem Relation Age of Onset  . Diabetes Father    No Known Allergies Prior to Admission medications   Medication Sig Start Date End Date Taking? Authorizing Provider  gabapentin (NEURONTIN) 300 MG capsule Take 1  tablet at bedtime 04/03/15  Yes Collene GobbleSteven A Conchita Truxillo, MD  predniSONE (DELTASONE) 10 MG tablet Take 4 a day for 3 days 3 a day for 3 days 2 a day for 3 days one a day for 3 days Patient not taking: Reported on 04/17/2015 04/03/15   Collene GobbleSteven A Meryl Ponder, MD  sulfamethoxazole-trimethoprim (BACTRIM DS,SEPTRA DS) 800-160 MG per tablet Take 1 tablet by mouth 2 (two) times daily. Patient not taking: Reported on 04/03/2015 05/19/14   Carmelina DaneJeffery S Anderson, MD   ROS: The patient denies fevers, chills, night sweats, unintentional weight loss, cough, chest pain, palpitations, wheezing, dyspnea on exertion, nausea, vomiting, abdominal pain, dysuria, hematuria, melena, numbness, weakness, or tingling. +myalgiaas   All other systems have been reviewed and were otherwise negative with the exception of those mentioned in the HPI and as above.    PHYSICAL EXAM: Filed Vitals:   04/17/15 0836  BP: 130/82  Pulse: 81  Temp: 98.8 F (37.1 C)  Resp: 18   Body mass index is 26.93 kg/(m^2).   General: Alert, no acute distress HEENT:  Normocephalic, atraumatic, oropharynx patent. [facility] Cardiovascular:  Regular rate and rhythm, no rubs murmurs or gallops. No Carotid bruits, radial pulse intact. No pedal edema.  Respiratory: Clear to auscultation bilaterally. No wheezes, rales, or rhonchi. No cyanosis, no use of accessory musculature Abdominal: No  organomegaly, abdomen is soft and non-tender, positive bowel sounds. No masses. Musculoskeletal: Gait intact. No edema. Minimal discomfort L parascapular area. Biceps tendon 2+. Absent brachial radial L arm. Triceps 2+. No focal weakness.  Skin: No rashes. Neurologic: Facial musculature symmetric. Psychiatric: Patient acts appropriately throughout our interaction. Lymphatic: No cervical or submandibular lymphadenopathy  LABS:  EKG/XRAY:   Primary read interpreted by Dr. Cleta Alberts at [facility]. EKG shows J-point elevation V2 no acute changes are  seen.  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 He does not have weakness but does have an absent brachial radialis reflex. We'll proceed with MRI. I did not want to put him back on prednisone so started motivate 15 mg 1 a day. He will be on Neurontin 300 mg 1-2 at bedtime and if not working can take one during the day. Will follow-up after his MRI.Michaell Cowing sideeffects, risk and benefits, and alternatives of medications d/w patient. Patient is aware that all medications have potential sideeffects and we are unable to predict every sideeffect or drug-drug interaction that may occur.  Lesle Chris MD 04/17/2015 8:45 AM    some

## 2015-04-17 NOTE — Patient Instructions (Addendum)
I am going to schedule yuou for an MRI of your neck. I had increased your medications back and shoulder discomfort. .Cervical Radiculopathy Cervical radiculopathy happens when a nerve in the neck (cervical nerve) is pinched or bruised. This condition can develop because of an injury or as part of the normal aging process. Pressure on the cervical nerves can cause pain or numbness that runs from the neck all the way down into the arm and fingers. Usually, this condition gets better with rest. Treatment may be needed if the condition does not improve.  CAUSES This condition may be caused by:  Injury.  Slipped (herniated) disk.  Muscle tightness in the neck because of overuse.  Arthritis.  Breakdown or degeneration in the bones and joints of the spine (spondylosis) due to aging.  Bone spurs that may develop near the cervical nerves. SYMPTOMS Symptoms of this condition include:  Pain that runs from the neck to the arm and hand. The pain can be severe or irritating. It may be worse when the neck is moved.  Numbness or weakness in the affected arm and hand. DIAGNOSIS This condition may be diagnosed based on symptoms, medical history, and a physical exam. You may also have tests, including:  X-rays.  CT scan.  MRI.  Electromyogram (EMG).  Nerve conduction tests. TREATMENT In many cases, treatment is not needed for this condition. With rest, the condition usually gets better over time. If treatment is needed, options may include:  Wearing a soft neck collar for short periods of time.  Physical therapy to strengthen your neck muscles.  Medicines, such as NSAIDs, oral corticosteroids, or spinal injections.  Surgery. This may be needed if other treatments do not help. Various types of surgery may be done depending on the cause of your problems. HOME CARE INSTRUCTIONS Managing Pain  Take over-the-counter and prescription medicines only as told by your health care  provider.  If directed, apply ice to the affected area.  Put ice in a plastic bag.  Place a towel between your skin and the bag.  Leave the ice on for 20 minutes, 2-3 times per day.  If ice does not help, you can try using heat. Take a warm shower or warm bath, or use a heat pack as told by your health care provider.  Try a gentle neck and shoulder massage to help relieve symptoms. Activity  Rest as needed. Follow instructions from your health care provider about any restrictions on activities.  Do stretching and strengthening exercises as told by your health care provider or physical therapist. General Instructions  If you were given a soft collar, wear it as told by your health care provider.  Use a flat pillow when you sleep.  Keep all follow-up visits as told by your health care provider. This is important. SEEK MEDICAL CARE IF:  Your condition does not improve with treatment. SEEK IMMEDIATE MEDICAL CARE IF:  Your pain gets much worse and cannot be controlled with medicines.  You have weakness or numbness in your hand, arm, face, or leg.  You have a high fever.  You have a stiff, rigid neck.  You lose control of your bowels or your bladder (have incontinence).  You have trouble with walking, balance, or speaking.   This information is not intended to replace advice given to you by your health care provider. Make sure you discuss any questions you have with your health care provider.   Document Released: 01/08/2001 Document Revised: 01/04/2015 Document  Reviewed: 06/09/2014 Elsevier Interactive Patient Education [employer].

## 2015-09-01 ENCOUNTER — Encounter: Payer: Self-pay | Admitting: Gastroenterology

## 2017-11-20 ENCOUNTER — Ambulatory Visit (INDEPENDENT_AMBULATORY_CARE_PROVIDER_SITE_OTHER): Payer: BLUE CROSS/BLUE SHIELD

## 2017-11-20 ENCOUNTER — Ambulatory Visit (INDEPENDENT_AMBULATORY_CARE_PROVIDER_SITE_OTHER): Payer: BLUE CROSS/BLUE SHIELD | Admitting: Physician Assistant

## 2017-11-20 ENCOUNTER — Encounter: Payer: Self-pay | Admitting: Physician Assistant

## 2017-11-20 ENCOUNTER — Other Ambulatory Visit: Payer: Self-pay

## 2017-11-20 VITALS — BP 145/85 | HR 64 | Temp 97.8°F | Wt 180.2 lb

## 2017-11-20 DIAGNOSIS — M545 Low back pain: Secondary | ICD-10-CM | POA: Diagnosis not present

## 2017-11-20 DIAGNOSIS — K5900 Constipation, unspecified: Secondary | ICD-10-CM | POA: Diagnosis not present

## 2017-11-20 DIAGNOSIS — R300 Dysuria: Secondary | ICD-10-CM | POA: Diagnosis not present

## 2017-11-20 LAB — POCT URINALYSIS DIP (MANUAL ENTRY)
Bilirubin, UA: NEGATIVE
Blood, UA: NEGATIVE
Glucose, UA: NEGATIVE mg/dL
Ketones, POC UA: NEGATIVE mg/dL
Leukocytes, UA: NEGATIVE
Nitrite, UA: NEGATIVE
Protein Ur, POC: NEGATIVE mg/dL
Spec Grav, UA: 1.02 (ref 1.010–1.025)
Urobilinogen, UA: 0.2 U/dL
pH, UA: 8.5 — AB (ref 5.0–8.0)

## 2017-11-20 LAB — POCT GLYCOSYLATED HEMOGLOBIN (HGB A1C): Hemoglobin A1C: 6.3 % — AB (ref 4.0–5.6)

## 2017-11-20 LAB — POC MICROSCOPIC URINALYSIS (UMFC): Mucus: ABSENT

## 2017-11-20 MED ORDER — MELOXICAM 15 MG PO TABS: 15.0000 mg | ORAL_TABLET | Freq: Every day | ORAL | 1 refills | Status: AC

## 2017-11-20 MED ORDER — POLYETHYLENE GLYCOL 3350 17 GM/SCOOP PO POWD: 17.0000 g | Freq: Two times a day (BID) | ORAL | 1 refills | Status: AC | PRN

## 2017-11-20 NOTE — Patient Instructions (Addendum)
1) you have pre-diabetes (see below) 2) you are constipated, which can contribute to your lower back pain (see below).  3) be sure you are drinking at least 64 oz water/daily.  4) for your back pain. Take Meloxicam 15mg /daily as needed. Meloxicam is an NSAID. Do not use with any other otc pain medication other than tylenol/acetaminophen - so no aleve, ibuprofen, motrin, advil, etc.   For constipation  Look up "6 Reasons to Care about Poop Health" at [employer] (http://www.edwards.info/www.precisionnutrition.com/blog/infographics)  1) Water: Make sure you are drinking enough water daily - about 1-3 liters. 2) Fiber: Make sure you are getting enough fiber in your diet - this will make you regular - you can eat high fiber foods or use metamucil as a supplement - it is really important to drink enough water when using fiber supplements. Foods that have a lot of fiber include vegetables, fruits, beans, nuts, oatmeal, and some breads and cereals. You can tell how much fiber is in a food by reading the nutrition label. Doctors recommend eating 25 to 36 grams of fiber each day. 3) Fitness: Increasing your physical activity will help increase the natural movement of your bowels. Try to get 20-30 minutes of exercise daily.  4) Use a 9" stool in front of your toilet to rest your feet on while you have a bowel movement. This will loosen your rectal muscles to help your stool come out easier and prevent straining.   For gentle treatment of constipation: Use Miralax 1-2 capfuls a day until your stools are soft and regular and then decrease the usage - you can use this daily  -------------------------------------------------------------------------------------------------------------------------------------------------------------- Preventing Type 2 Diabetes Mellitus Type 2 diabetes (type 2 diabetes mellitus) is a long-term (chronic) disease that affects blood sugar (glucose) levels. Normally, a hormone called insulin allows  glucose to enter cells in the body. The cells use glucose for energy. In type 2 diabetes, one or both of these problems may be present:  The body does not make enough insulin.  The body does not respond properly to insulin that it makes (insulin resistance).  Insulin resistance or lack of insulin causes excess glucose to build up in the blood instead of going into cells. As a result, high blood glucose (hyperglycemia) develops, which can cause many complications. Being overweight or obese and having an inactive (sedentary) lifestyle can increase your risk for diabetes. Type 2 diabetes can be delayed or prevented by making certain nutrition and lifestyle changes. What nutrition changes can be made?  Eat healthy meals and snacks regularly. Keep a healthy snack with you for when you get hungry between meals, such as fruit or a handful of nuts.  Eat lean meats and proteins that are low in saturated fats, such as chicken, fish, egg whites, and beans. Avoid processed meats.  Eat plenty of fruits and vegetables and plenty of grains that have not been processed (whole grains). It is recommended that you eat: ? 1?2 cups of fruit every day. ? 2?3 cups of vegetables every day. ? 6?8 oz of whole grains every day, such as oats, whole wheat, bulgur, brown rice, quinoa, and millet.  Eat low-fat dairy products, such as milk, yogurt, and cheese.  Eat foods that contain healthy fats, such as nuts, avocado, olive oil, and canola oil.  Drink water throughout the day. Avoid drinks that contain added sugar, such as soda or sweet tea.  Follow instructions from your health care provider about specific eating or drinking restrictions.  Control  how much food you eat at a time (portion size). ? Check food labels to find out the serving sizes of foods. ? Use a kitchen scale to weigh amounts of foods.  Saute or steam food instead of frying it. Cook with water or broth instead of oils or butter.  Limit your  intake of: ? Salt (sodium). Have no more than 1 tsp (2,400 mg) of sodium a day. If you have heart disease or high blood pressure, have less than ? tsp (1,500 mg) of sodium a day. ? Saturated fat. This is fat that is solid at room temperature, such as butter or fat on meat. What lifestyle changes can be made?  Activity  Do moderate-intensity physical activity for at least 30 minutes on at least 5 days of the week, or as much as told by your health care provider.  Ask your health care provider what activities are safe for you. A mix of physical activities may be best, such as walking, swimming, cycling, and strength training.  Try to add physical activity into your day. For example: ? Park in spots that are farther away than usual, so that you walk more. For example, park in a far corner of the parking lot when you go to the office or the grocery store. ? Take a walk during your lunch break. ? Use stairs instead of elevators or escalators. Weight Loss  Lose weight as directed. Your health care provider can determine how much weight loss is best for you and can help you lose weight safely.  If you are overweight or obese, you may be instructed to lose at least 5?7 % of your body weight. Alcohol and Tobacco   Limit alcohol intake to no more than 1 drink a day for nonpregnant women and 2 drinks a day for men. One drink equals 12 oz of beer, 5 oz of wine, or 1 oz of hard liquor.  Do not use any tobacco products, such as cigarettes, chewing tobacco, and e-cigarettes. If you need help quitting, ask your health care provider. Work With Your Health Care Provider  Have your blood glucose tested regularly, as told by your health care provider.  Discuss your risk factors and how you can reduce your risk for diabetes.  Get screening tests as told by your health care provider. You may have screening tests regularly, especially if you have certain risk factors for type 2 diabetes.  Make an  appointment with a diet and nutrition specialist (registered dietitian). A registered dietitian can help you make a healthy eating plan and can help you understand portion sizes and food labels. Why are these changes important?  It is possible to prevent or delay type 2 diabetes and related health problems by making lifestyle and nutrition changes.  It can be difficult to recognize signs of type 2 diabetes. The best way to avoid possible damage to your body is to take actions to prevent the disease before you develop symptoms. What can happen if changes are not made?  Your blood glucose levels may keep increasing. Having high blood glucose for a long time is dangerous. Too much glucose in your blood can damage your blood vessels, heart, kidneys, nerves, and eyes.  You may develop prediabetes or type 2 diabetes. Type 2 diabetes can lead to many chronic health problems and complications, such as: ? Heart disease. ? Stroke. ? Blindness. ? Kidney disease. ? Depression. ? Poor circulation in the feet and legs, which could lead to surgical  removal (amputation) in severe cases. Where to find support:  Ask your health care provider to recommend a registered dietitian, diabetes educator, or weight loss program.  Look for local or online weight loss groups.  Join a gym, fitness club, or outdoor activity group, such as a walking club. Where to find more information: To learn more about diabetes and diabetes prevention, visit:  American Diabetes Association (ADA): www.diabetes.[employer] of Diabetes and Digestive and Kidney Diseases: ToyArticles.ca  To learn more about healthy eating, visit:  The U.S. Department of Agriculture Architect), Choose My Plate: http://yates.biz/  Office of Disease Prevention and Health Promotion (ODPHP), Dietary Guidelines: ListingMagazine.si  Summary  You can reduce your risk for type 2  diabetes by increasing your physical activity, eating healthy foods, and losing weight as directed.  Talk with your health care provider about your risk for type 2 diabetes. Ask about any blood tests or screening tests that you need to have. This information is not intended to replace advice given to you by your health care provider. Make sure you discuss any questions you have with your health care provider. Document Released: 08/07/2015 Document Revised: 09/21/2015 Document Reviewed: 06/06/2015 Elsevier Interactive Patient Education  2018 [employer].  IF you received an x-ray today, you will receive an invoice from [facility]. Please contact [facility] at [phone] with questions or concerns regarding your invoice.   IF you received labwork today, you will receive an invoice from [facility]. Please contact [facility] at [phone] with questions or concerns regarding your invoice.   Our billing staff will not be able to assist you with questions regarding bills from these companies.  You will be contacted with the lab results as soon as they are available. The fastest way to get your results is to activate your My Chart account. Instructions are located on the last page of this paperwork. If you have not heard from [place] regarding the results in 2 weeks, please contact this office.

## 2017-11-20 NOTE — Progress Notes (Signed)
Joshua Kelley  MRN: [id number] DOB: 1960-08-06  PCP: Darlyne Russian, MD  Subjective:  Pt is a 57 year old male who presents to clinic for several complaints  1) dysuria x 3 weeks. This is improving. Happens about every other day. No new sexual partners. Has intercourse with wife only. Ramada recently finished in June - did not drink water all day, only at night. Denies fever, chills, hematuria, pain of testicles or penis, discharge from penis, abdominal pain, difficulty starting stream, incomplete voiding.   2) Also c/o b/l lower back pain. "Feels like right side is tight, then suddenly is on the left side". 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 Sometimes radiates down right thigh. Does not happen daily. Pain is not present currently. Sometimes takes Tylenol for this, which helps sometimes.  Stools are well formed, soft, no straining.  Sometimes when he eats he feels full early. Not all the time. Happens with certain foods, but he cannot recall which ones.   Review of Systems  Constitutional: Negative for chills and fever.  Gastrointestinal: Negative for abdominal pain, constipation, diarrhea, nausea and vomiting.  Genitourinary: Positive for dysuria. Negative for difficulty urinating, discharge, flank pain, frequency, hematuria, penile pain, testicular pain and urgency.  Musculoskeletal: Positive for back pain (b/l). Negative for gait problem, neck pain and neck stiffness.    Patient Active Problem List   Diagnosis Date Noted  . ABDOMINAL PAIN OTHER SPECIFIED SITE 01/31/2009  . CERVICALGIA 02/05/2008  . HEEL PAIN, BILATERAL 02/05/2008  . FATIGUE 02/05/2008  . PARESTHESIA, HANDS 02/05/2008    Current Outpatient Medications on File Prior to Visit  Medication Sig Dispense Refill  . gabapentin (NEURONTIN) 300 MG capsule Take 1-2 tablets at bedtime and if not working can take one capsule during the day (Patient not taking: Reported on 11/20/2017)  45 capsule 3  . meloxicam (MOBIC) 15 MG tablet Take 1 tablet (15 mg total) by mouth daily. (Patient not taking: Reported on 11/20/2017) 14 tablet 1  . predniSONE (DELTASONE) 10 MG tablet Take 4 a day for 3 days 3 a day for 3 days 2 a day for 3 days one a day for 3 days (Patient not taking: Reported on 04/17/2015) 30 tablet 0  . sulfamethoxazole-trimethoprim (BACTRIM DS,SEPTRA DS) 800-160 MG per tablet Take 1 tablet by mouth 2 (two) times daily. (Patient not taking: Reported on 04/03/2015) 20 tablet 0   No current facility-administered medications on file prior to visit.     No Known Allergies   Objective:  BP (!) 145/85 (BP Location: Left Arm, Patient Position: Sitting, Cuff Size: Normal)   Pulse 64   Temp 97.8 F (36.6 C) (Oral)   Wt 180 lb 3.2 oz (81.7 kg)   SpO2 100%   BMI 25.13 kg/m   Physical Exam  Constitutional: He is oriented to person, place, and time. He appears well-developed and well-nourished.  Cardiovascular: Normal rate and regular rhythm.  Abdominal: Soft. Normal appearance. There is no tenderness. There is no rigidity and no guarding.  Musculoskeletal:       Lumbar back: He exhibits normal range of motion, no tenderness and no bony tenderness.  Neurological: He is alert and oriented to person, place, and time. No sensory deficit.  Skin: Skin is warm and dry.  Psychiatric: He has a normal mood and affect. His behavior is normal. Judgment and thought content normal.  Vitals reviewed.  Dg Abd 2 Views  Result Date: 11/20/2017  CLINICAL DATA:  Abdominal pain EXAM: ABDOMEN - 2 VIEW COMPARISON:  01/12/2009 FINDINGS: Scattered large and small bowel gas is noted. Fecal material is noted throughout the colon consistent with a degree of constipation. No obstructive changes are seen. No bony abnormality is seen. No abnormal calcifications are noted. IMPRESSION: Changes of mild constipation. Electronically Signed   By: Inez Catalina M.D.   On: 11/20/2017 12:05   Results for orders  placed or performed in visit on 11/20/17  POCT urinalysis dipstick  Result Value Ref Range   Color, UA yellow yellow   Clarity, UA clear clear   Glucose, UA negative negative mg/dL   Bilirubin, UA negative negative   Ketones, POC UA negative negative mg/dL   Spec Grav, UA 1.020 1.010 - 1.025   Blood, UA negative negative   pH, UA 8.5 (A) 5.0 - 8.0   Protein Ur, POC negative negative mg/dL   Urobilinogen, UA 0.2 0.2 or 1.0 E.U./dL   Nitrite, UA Negative Negative   Leukocytes, UA Negative Negative  POCT Microscopic Urinalysis ([facility])  Result Value Ref Range   WBC,UR,HPF,POC None None WBC/hpf   RBC,UR,HPF,POC None None RBC/hpf   Bacteria None None, Too numerous to count   Mucus Absent Absent   Epithelial Cells, UR Per Microscopy None None, Too numerous to count cells/hpf  POCT glycosylated hemoglobin (Hb A1C)  Result Value Ref Range   Hemoglobin A1C 6.3 (A) 4.0 - 5.6 %   HbA1c POC (<> result, manual entry)  4.0 - 5.6 %   HbA1c, POC (prediabetic range)  5.7 - 6.4 %   HbA1c, POC (controlled diabetic range)  0.0 - 7.0 %    Assessment and Plan :  1. Acute bilateral low back pain, with sciatica presence unspecified -Patient presents with an interesting constellation of symptoms including intermittent burning with urination, transient low back pain and feeling full early.  No red flags.  UA is significant for slightly increased urinary pH.  Abdominal x-ray shows constipation.  His A1c is 6.3.  He has never been told he has prediabetes.  Discussed this with patient and need to carb diet to eliminate sugars.  Information printed out for patient.  He will follow-up in 6 months for recheck A1c.  Also discussed constipation treatment with MiraLAX. - POCT urinalysis dipstick - POCT Microscopic Urinalysis ([facility]) - CMP14+EGFR - DG Abd 2 Views; Future - meloxicam (MOBIC) 15 MG tablet; Take 1 tablet (15 mg total) by mouth daily.  Dispense: 14 tablet; Refill: 1  2. Dysuria -  Chlamydia/Gonococcus/Trichomonas, NAA - POCT glycosylated hemoglobin (Hb A1C)  3. Constipation, unspecified constipation type - polyethylene glycol powder (GLYCOLAX/MIRALAX) powder; Take 17 g by mouth 2 (two) times daily as needed.  Dispense: 578 g; Refill: 1    Whitney Areanna Gengler, PA-C  [facility] 11/20/2017 11:18 AM

## 2017-11-21 LAB — CMP14+EGFR
ALT: 14 IU/L (ref 0–44)
AST: 15 IU/L (ref 0–40)
Albumin/Globulin Ratio: 2.1 (ref 1.2–2.2)
Albumin: 4.7 g/dL (ref 3.5–5.5)
Alkaline Phosphatase: 120 IU/L — ABNORMAL HIGH (ref 39–117)
BUN/Creatinine Ratio: 12 (ref 9–20)
BUN: 9 mg/dL (ref 6–24)
Bilirubin Total: 0.3 mg/dL (ref 0.0–1.2)
CO2: 28 mmol/L (ref 20–29)
Calcium: 10.3 mg/dL — ABNORMAL HIGH (ref 8.7–10.2)
Chloride: 99 mmol/L (ref 96–106)
Creatinine, Ser: 0.73 mg/dL — ABNORMAL LOW (ref 0.76–1.27)
GFR calc Af Amer: 117 mL/min/{1.73_m2} (ref >59)
GFR calc non Af Amer: 102 mL/min/{1.73_m2} (ref >59)
Globulin, Total: 2.2 g/dL (ref 1.5–4.5)
Glucose: 116 mg/dL — ABNORMAL HIGH (ref 65–99)
Potassium: 4.4 mmol/L (ref 3.5–5.2)
Sodium: 141 mmol/L (ref 134–144)
Total Protein: 6.9 g/dL (ref 6.0–8.5)

## 2017-11-21 LAB — CHLAMYDIA/GONOCOCCUS/TRICHOMONAS, NAA
Chlamydia by NAA: NEGATIVE
Gonococcus by NAA: NEGATIVE
Trich vag by NAA: NEGATIVE

## 2017-12-05 ENCOUNTER — Encounter: Payer: Self-pay | Admitting: Physician Assistant

## 2018-03-12 ENCOUNTER — Telehealth: Payer: Self-pay | Admitting: Physician Assistant

## 2018-03-12 NOTE — Telephone Encounter (Signed)
LVM for pt to call the office and reschedule their appt on 05/29/18 with McVey. We will need to get pt rescheduled with either Dr. Creta LevinStallings, Dr. Leretha PolSantiago or Dr. Alvy BimlerSagardia.  When pt calls back, please schedule for an OV - Establish Care - McVey pt - A1C CHECK Thank you!

## 2018-05-29 ENCOUNTER — Ambulatory Visit: Payer: BLUE CROSS/BLUE SHIELD | Admitting: Physician Assistant

## 2019-02-01 ENCOUNTER — Encounter: Payer: Self-pay | Admitting: Gastroenterology

## 2019-02-16 IMAGING — DX DG ABDOMEN 2V
2 series · 2 of 2 positions shown · non-contrast
Comparison: 01/12/2009

CLINICAL DATA: Abdominal pain

EXAM:
ABDOMEN - 2 VIEW

[abdomen erect]
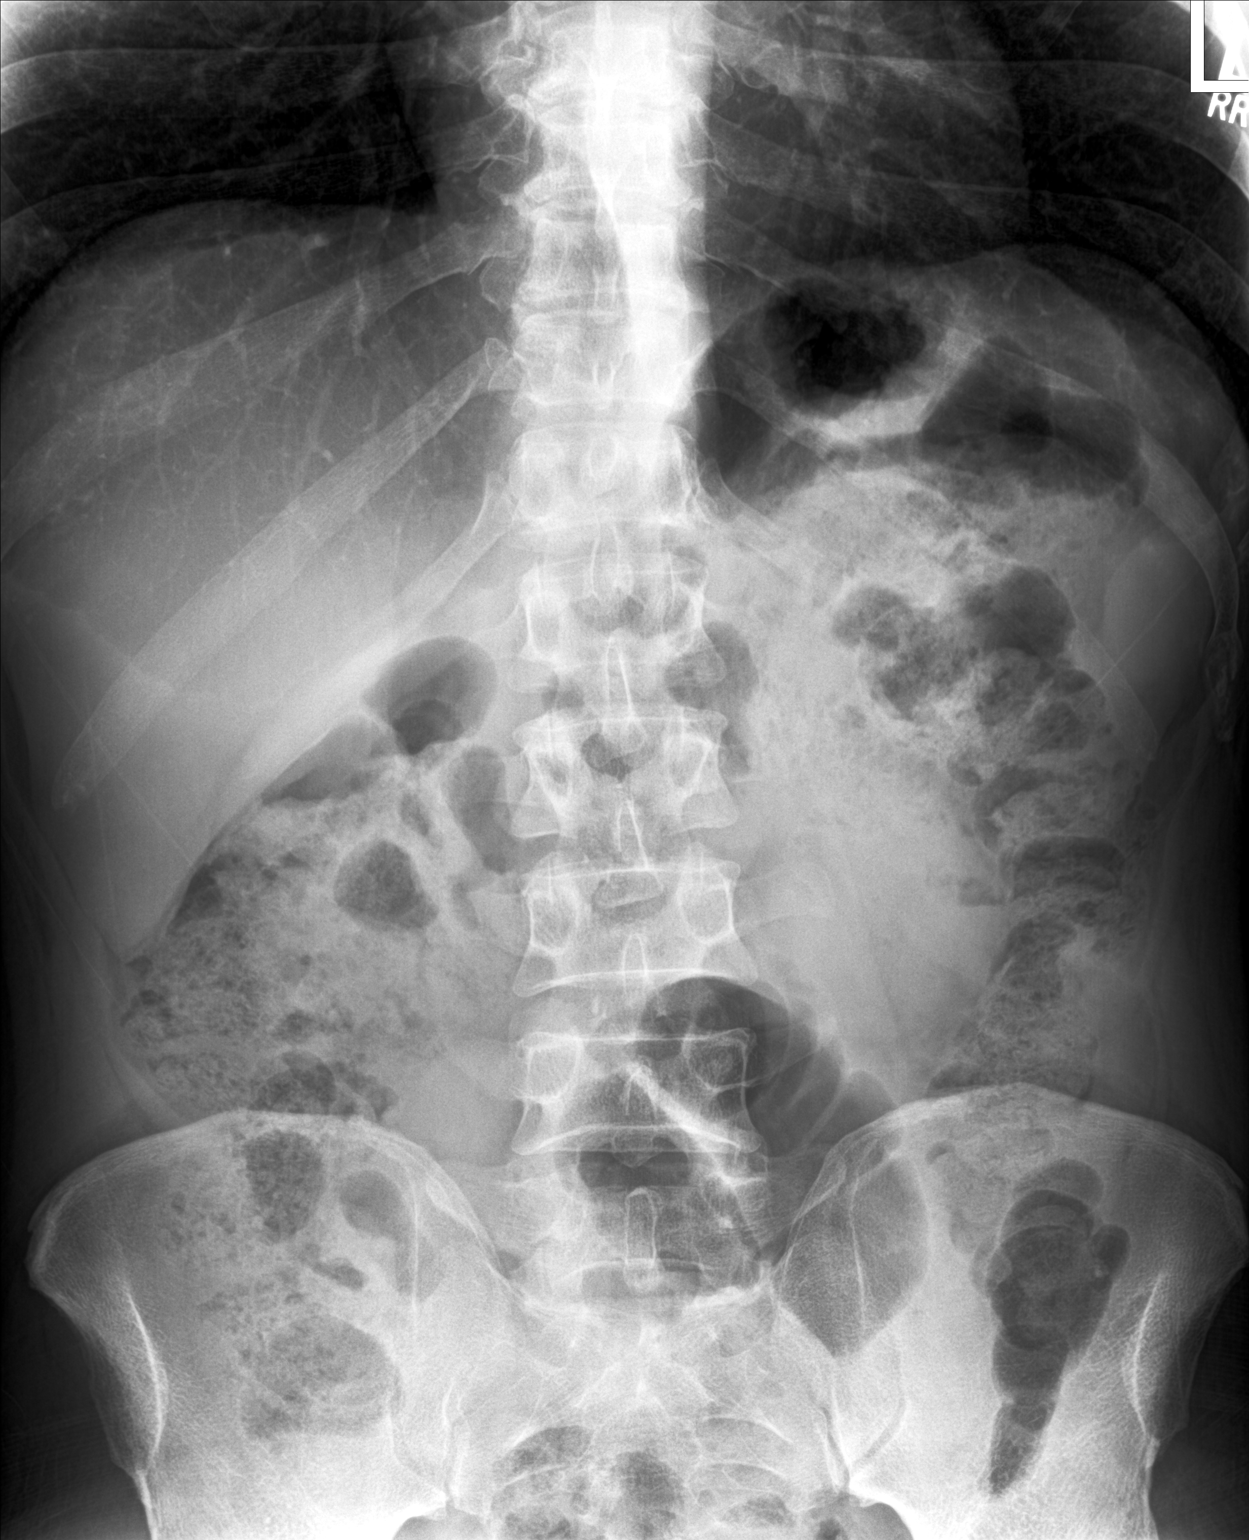

[abdomen supine]
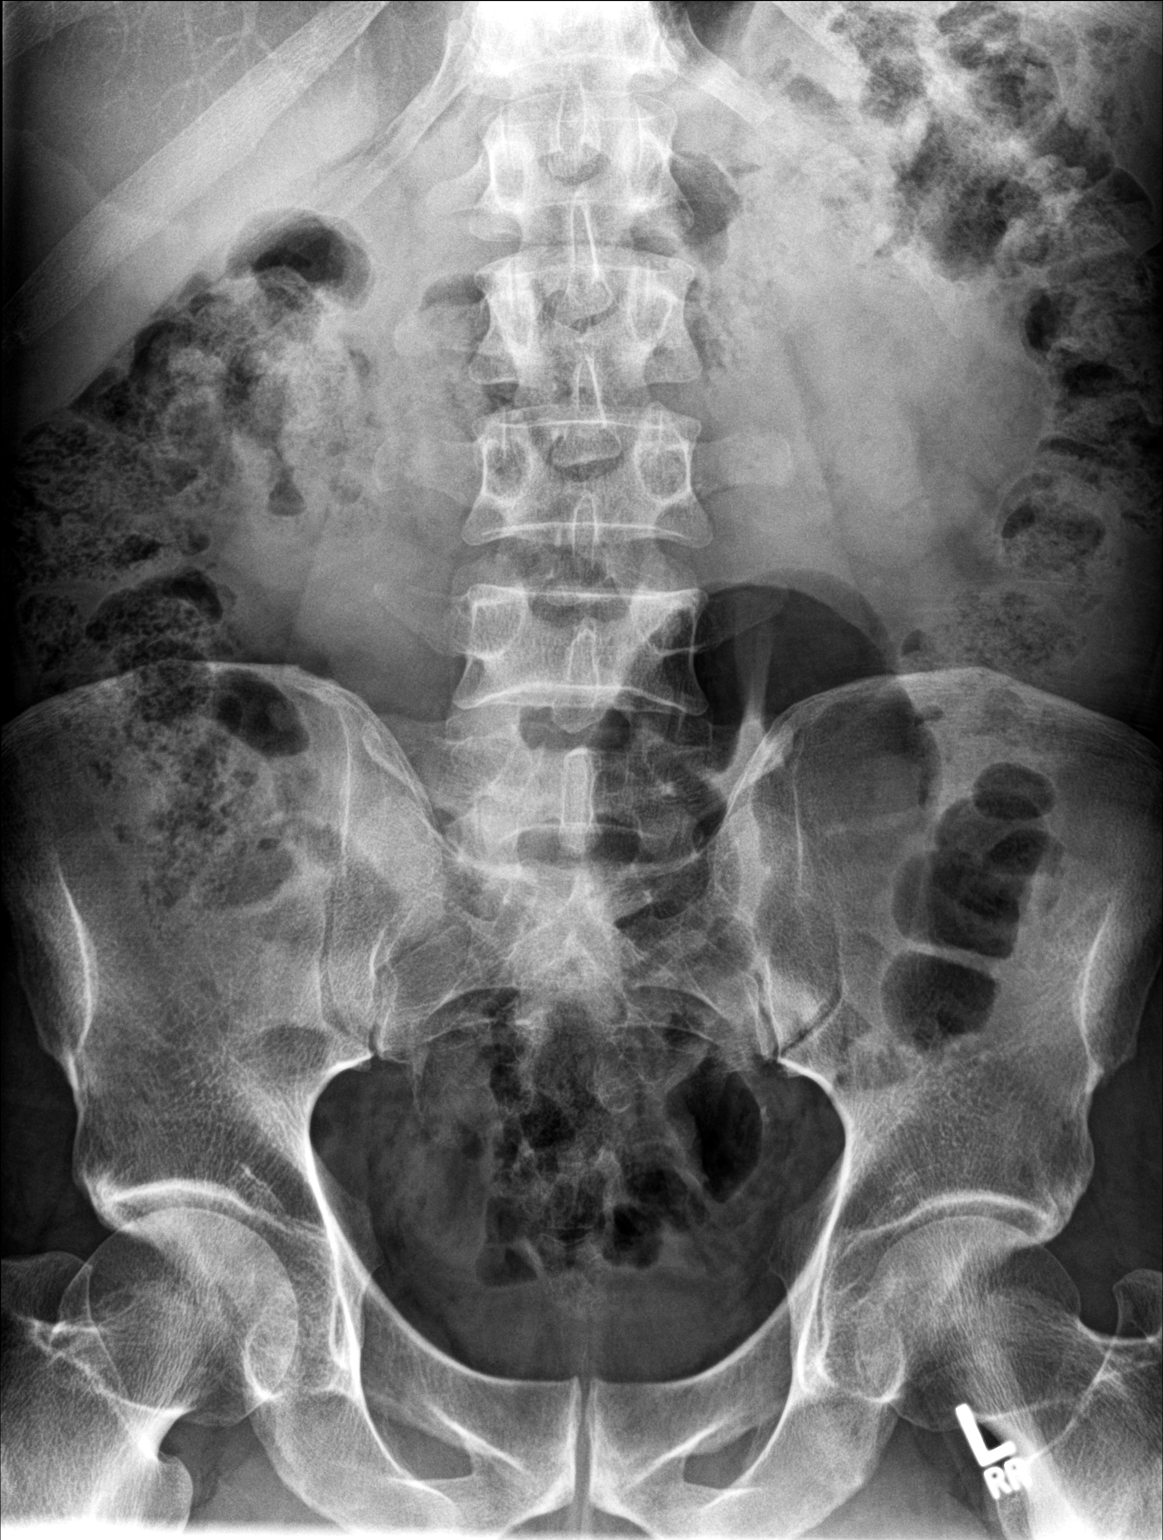

[2 of 2 positions shown; findings below may reference images not displayed]

FINDINGS: Scattered large and small bowel gas is noted. Fecal material is
noted throughout the colon consistent with a degree of constipation.
No obstructive changes are seen. No bony abnormality is seen. No
abnormal calcifications are noted.
IMPRESSION: Changes of mild constipation.

## 2019-05-25 DIAGNOSIS — K1120 Sialoadenitis, unspecified: Secondary | ICD-10-CM | POA: Diagnosis not present

## 2020-05-10 DIAGNOSIS — R0982 Postnasal drip: Secondary | ICD-10-CM | POA: Diagnosis not present

## 2020-05-10 DIAGNOSIS — Z20822 Contact with and (suspected) exposure to covid-19: Secondary | ICD-10-CM | POA: Diagnosis not present

## 2020-05-10 DIAGNOSIS — R059 Cough, unspecified: Secondary | ICD-10-CM | POA: Diagnosis not present

## 2020-05-30 DIAGNOSIS — Z6824 Body mass index (BMI) 24.0-24.9, adult: Secondary | ICD-10-CM | POA: Diagnosis not present

## 2020-05-30 DIAGNOSIS — R0602 Shortness of breath: Secondary | ICD-10-CM | POA: Diagnosis not present

## 2020-05-30 DIAGNOSIS — R5383 Other fatigue: Secondary | ICD-10-CM | POA: Diagnosis not present

## 2020-05-30 DIAGNOSIS — Z1159 Encounter for screening for other viral diseases: Secondary | ICD-10-CM | POA: Diagnosis not present

## 2020-05-30 DIAGNOSIS — Z13220 Encounter for screening for lipoid disorders: Secondary | ICD-10-CM | POA: Diagnosis not present

## 2020-05-30 DIAGNOSIS — Z0000 Encounter for general adult medical examination without abnormal findings: Secondary | ICD-10-CM | POA: Diagnosis not present

## 2020-05-30 DIAGNOSIS — Z1339 Encounter for screening examination for other mental health and behavioral disorders: Secondary | ICD-10-CM | POA: Diagnosis not present

## 2020-05-30 DIAGNOSIS — E559 Vitamin D deficiency, unspecified: Secondary | ICD-10-CM | POA: Diagnosis not present

## 2020-05-30 DIAGNOSIS — Z125 Encounter for screening for malignant neoplasm of prostate: Secondary | ICD-10-CM | POA: Diagnosis not present

## 2020-06-06 DIAGNOSIS — E789 Disorder of lipoprotein metabolism, unspecified: Secondary | ICD-10-CM | POA: Diagnosis not present

## 2020-06-06 DIAGNOSIS — R946 Abnormal results of thyroid function studies: Secondary | ICD-10-CM | POA: Diagnosis not present

## 2020-06-06 DIAGNOSIS — E559 Vitamin D deficiency, unspecified: Secondary | ICD-10-CM | POA: Diagnosis not present

## 2020-06-06 DIAGNOSIS — Z131 Encounter for screening for diabetes mellitus: Secondary | ICD-10-CM | POA: Diagnosis not present

## 2020-06-15 DIAGNOSIS — R946 Abnormal results of thyroid function studies: Secondary | ICD-10-CM | POA: Diagnosis not present

## 2020-10-11 DIAGNOSIS — R7303 Prediabetes: Secondary | ICD-10-CM | POA: Diagnosis not present

## 2020-10-11 DIAGNOSIS — I10 Essential (primary) hypertension: Secondary | ICD-10-CM | POA: Diagnosis not present

## 2020-10-11 DIAGNOSIS — E0590 Thyrotoxicosis, unspecified without thyrotoxic crisis or storm: Secondary | ICD-10-CM | POA: Diagnosis not present

## 2022-06-18 ENCOUNTER — Other Ambulatory Visit: Payer: Self-pay

## 2022-06-18 ENCOUNTER — Encounter (HOSPITAL_BASED_OUTPATIENT_CLINIC_OR_DEPARTMENT_OTHER): Payer: Self-pay

## 2022-06-18 DIAGNOSIS — K148 Other diseases of tongue: Secondary | ICD-10-CM | POA: Diagnosis not present

## 2022-06-18 DIAGNOSIS — Z1152 Encounter for screening for COVID-19: Secondary | ICD-10-CM | POA: Diagnosis not present

## 2022-06-18 DIAGNOSIS — T783XXA Angioneurotic edema, initial encounter: Secondary | ICD-10-CM | POA: Diagnosis not present

## 2022-06-18 NOTE — ED Triage Notes (Signed)
Patient arrives to ED POV c/o Tongue swelling x4 days. Swelling noted under left side of tongue. Pt states that this has happen before but is not sure what is causing it. States its painful to eat and swallow. Airway intact. No distress noted at this time.

## 2022-06-19 ENCOUNTER — Emergency Department (HOSPITAL_BASED_OUTPATIENT_CLINIC_OR_DEPARTMENT_OTHER)
Admission: EM | Admit: 2022-06-19 | Discharge: 2022-06-19 | Disposition: A | Discharge status: Home / Self Care | Payer: BC Managed Care – PPO | Attending: Emergency Medicine | Admitting: Emergency Medicine

## 2022-06-19 DIAGNOSIS — T783XXA Angioneurotic edema, initial encounter: Secondary | ICD-10-CM

## 2022-06-19 LAB — CBC WITH DIFFERENTIAL/PLATELET
Abs Immature Granulocytes: 0.01 10*3/uL (ref 0.00–0.07)
Basophils Absolute: 0 10*3/uL (ref 0.0–0.1)
Basophils Relative: 0 %
Eosinophils Absolute: 0 10*3/uL (ref 0.0–0.5)
Eosinophils Relative: 1 %
HCT: 34.7 % — ABNORMAL LOW (ref 39.0–52.0)
Hemoglobin: 12.4 g/dL — ABNORMAL LOW (ref 13.0–17.0)
Immature Granulocytes: 0 %
Lymphocytes Relative: 12 %
Lymphs Abs: 0.7 10*3/uL (ref 0.7–4.0)
MCH: 29.7 pg (ref 26.0–34.0)
MCHC: 35.7 g/dL (ref 30.0–36.0)
MCV: 83.2 fL (ref 80.0–100.0)
Monocytes Absolute: 0.2 10*3/uL (ref 0.1–1.0)
Monocytes Relative: 4 %
Neutro Abs: 5.3 10*3/uL (ref 1.7–7.7)
Neutrophils Relative %: 83 %
Platelets: 235 10*3/uL (ref 150–400)
RBC: 4.17 MIL/uL — ABNORMAL LOW (ref 4.22–5.81)
RDW: 11.7 % (ref 11.5–15.5)
WBC: 6.3 10*3/uL (ref 4.0–10.5)
nRBC: 0 % (ref 0.0–0.2)

## 2022-06-19 LAB — BASIC METABOLIC PANEL
Anion gap: 9 (ref 5–15)
BUN: 11 mg/dL (ref 8–23)
CO2: 27 mmol/L (ref 22–32)
Calcium: 9.9 mg/dL (ref 8.9–10.3)
Chloride: 100 mmol/L (ref 98–111)
Creatinine, Ser: 0.64 mg/dL (ref 0.61–1.24)
GFR, Estimated: >60 mL/min (ref >60)
Glucose, Bld: 148 mg/dL — ABNORMAL HIGH (ref 70–99)
Potassium: 3.9 mmol/L (ref 3.5–5.1)
Sodium: 136 mmol/L (ref 135–145)

## 2022-06-19 LAB — RESP PANEL BY RT-PCR (RSV, FLU A&B, COVID)  RVPGX2
Influenza A by PCR: NEGATIVE
Influenza B by PCR: NEGATIVE
Resp Syncytial Virus by PCR: NEGATIVE
SARS Coronavirus 2 by RT PCR: NEGATIVE

## 2022-06-19 MED ORDER — TRANEXAMIC ACID-NACL 1000-0.7 MG/100ML-% IV SOLN
1000.0000 mg | INTRAVENOUS | Status: AC
  Administered 2022-06-19: 1000 mg via INTRAVENOUS
  Filled 2022-06-19: qty 100

## 2022-06-19 MED ORDER — PREDNISONE 20 MG PO TABS: 40.0000 mg | ORAL_TABLET | Freq: Every day | ORAL | 0 refills | Status: AC

## 2022-06-19 MED ORDER — CHLORHEXIDINE GLUCONATE 0.12 % MT SOLN: 15.0000 mL | Freq: Two times a day (BID) | OROMUCOSAL | Status: DC

## 2022-06-19 MED ORDER — METHYLPREDNISOLONE SODIUM SUCC 125 MG IJ SOLR
125.0000 mg | INTRAMUSCULAR | Status: AC
  Administered 2022-06-19: 125 mg via INTRAVENOUS
  Filled 2022-06-19: qty 2

## 2022-06-19 MED ORDER — FAMOTIDINE IN NACL 20-0.9 MG/50ML-% IV SOLN
20.0000 mg | Freq: Once | INTRAVENOUS | Status: AC
  Administered 2022-06-19: 20 mg via INTRAVENOUS
  Filled 2022-06-19: qty 50

## 2022-06-19 MED ORDER — CHLORHEXIDINE GLUCONATE 0.12 % MT SOLN: 15.0000 mL | Freq: Two times a day (BID) | OROMUCOSAL | 0 refills | Status: AC

## 2022-06-19 MED ORDER — LORATADINE 10 MG PO TABS
10.0000 mg | ORAL_TABLET | Freq: Once | ORAL | Status: AC
  Administered 2022-06-19: 10 mg via ORAL
  Filled 2022-06-19: qty 1

## 2022-06-19 MED ORDER — DIPHENHYDRAMINE HCL 50 MG/ML IJ SOLN
25.0000 mg | Freq: Once | INTRAMUSCULAR | Status: AC
  Administered 2022-06-19: 25 mg via INTRAVENOUS
  Filled 2022-06-19: qty 1

## 2022-06-19 NOTE — Plan of Care (Signed)
Spoke to Dr. Randal Buba.  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  Etiology unclear, ?hereditary. Not on ACE inhibitor.  Presented with tongue swelling (mostly on the left) x 4 days.  He is protecting his airway and able to manage his secretions.  Not hypoxic, satting 99-100% on room air.  EDP discussed case with Dr. Lynetta Mare with critical care who felt that the patient did not require ICU admission.  Case was also discussed with Dr. Marcelline Deist with ENT who will see the patient in consultation.  Patient received Benadryl, Claritin, Solu-Medrol 125 mg, Pepcid, and tranexamic acid in the ED.  [facility] will assume care on arrival to accepting facility. Until arrival, care as per EDP. However, [facility] available 24/7 for questions and assistance.  Nursing staff, please page [facility] and Consults [phone]) as soon as the patient arrives to the hospital.

## 2022-06-19 NOTE — ED Notes (Signed)
Dawn at Patient Placement will CANCEL Bed Request.-ABB(NS)

## 2022-06-19 NOTE — ED Provider Notes (Signed)
8:22 AM On evaluation of patient he feels like the tongue swelling is improved but not totally gone.  However he feels like he can swallow a lot better.  He noted that he was spitting up a little blood and then a chunk of something came out of his mouth.  He has a missing premolar on the left mandibular side.  However what came out is large and shapes almost like a peanut or some sort of deposit.  It does not look like a tooth and is more beige in color.  However the bleeding is not coming from the area of the tooth but when evaluating the floor of his mouth there appears to be a small wound/laceration.  Unclear how long this object has been in his mouth.  I wonder if this is causing his swelling.  Right now there is no bleeding in the floor the mouth is not swollen despite the small wound.  The wound is left sided which seems to be the side of the tongue that was more swollen. I highly doubt this is Ludwig angina.  Tongue still seems a little swollen so we will continue to observe and he does not yet have a bed at the admitting hospital. Will give Peridex for the wound.  9:02 AM Patient would like to be discharged.  He feels like the swelling is still improving. Brother is here and agrees. Would like discharge in about 1 hour (brother has to go but can be back in 1 hour). I think as long as he doesn't worsen this is reasonable. Will d/c with peridex and prednisone.    Sherwood Gambler, MD 06/19/22 [phone]

## 2022-06-19 NOTE — Discharge Instructions (Addendum)
If you develop fever, new or worsening tongue swelling, tongue pain or discharge, bleeding, or any other new/concerning symptoms then return to the ER or call 911.

## 2022-06-19 NOTE — ED Notes (Addendum)
Pt in bed, eyes closed, equal unlabored chest rise and fall.

## 2022-06-19 NOTE — ED Provider Notes (Signed)
[facility] Provider Note   CSN: [id number] Arrival date & time: 06/18/22  2046     History  Chief Complaint  Patient presents with   Oral Swelling    Joshua Kelley is a 62 y.o. male.  The history is provided by the patient.  Illness Location:  Tongue Quality:  Swelling Severity:  Severe Onset quality:  Gradual Duration:  4 days Timing:  Constant Progression:  Unchanged Chronicity:  Recurrent Context:  Denies regular medication Relieved by:  Nothing Worsened by:  Nothing Ineffective treatments:  None Associated symptoms: no vomiting and no wheezing       Past Medical History:  Diagnosis Date   Neuromuscular disorder ([place])      Home Medications Prior to Admission medications   Medication Sig Start Date End Date Taking? Authorizing Provider  gabapentin (NEURONTIN) 300 MG capsule Take 1-2 tablets at bedtime and if not working can take one capsule during the day Patient not taking: Reported on 11/20/2017 04/17/15   Darlyne Russian, MD  meloxicam (MOBIC) 15 MG tablet Take 1 tablet (15 mg total) by mouth daily. 11/20/17   McVey, Gelene Mink, PA-C  polyethylene glycol powder (GLYCOLAX/MIRALAX) powder Take 17 g by mouth 2 (two) times daily as needed. 11/20/17   McVey, Gelene Mink, PA-C  predniSONE (DELTASONE) 10 MG tablet Take 4 a day for 3 days 3 a day for 3 days 2 a day for 3 days one a day for 3 days Patient not taking: Reported on 04/17/2015 04/03/15   Darlyne Russian, MD  sulfamethoxazole-trimethoprim (BACTRIM DS,SEPTRA DS) 800-160 MG per tablet Take 1 tablet by mouth 2 (two) times daily. Patient not taking: Reported on 04/03/2015 05/19/14   Roselee Culver, MD      Allergies    Patient has no known allergies.    Review of Systems   Review of Systems  HENT:  Negative for drooling.        Tongue swelling  Eyes:  Negative for redness.  Respiratory:  Negative for wheezing and stridor.    Gastrointestinal:  Negative for vomiting.  All other systems reviewed and are negative.   Physical Exam Updated Vital Signs BP (!) 143/94   Pulse 87   Temp 100.2 F (37.9 C)   Resp 17   Ht 5' 10"$  (1.778 m)   Wt 82.6 kg   SpO2 99%   BMI 26.11 kg/m  Physical Exam Vitals and nursing note reviewed.  Constitutional:      General: He is not in acute distress.    Appearance: He is well-developed. He is not diaphoretic.  HENT:     Head: Normocephalic and atraumatic.     Nose: Nose normal.     Mouth/Throat:     Comments: Angioedema of the tongue left side more pronounced  Eyes:     Conjunctiva/sclera: Conjunctivae normal.     Pupils: Pupils are equal, round, and reactive to light.  Cardiovascular:     Rate and Rhythm: Normal rate and regular rhythm.  Pulmonary:     Effort: Pulmonary effort is normal.     Breath sounds: Normal breath sounds. No wheezing or rales.  Abdominal:     General: Bowel sounds are normal.     Palpations: Abdomen is soft.     Tenderness: There is no abdominal tenderness. There is no guarding or rebound.  Musculoskeletal:        General: Normal range of motion.     Cervical  back: Normal range of motion and neck supple.  Skin:    General: Skin is warm and dry.  Neurological:     Mental Status: He is alert and oriented to person, place, and time.     ED Results / Procedures / Treatments   Labs (all labs ordered are listed, but only abnormal results are displayed) Results for orders placed or performed during the hospital encounter of 06/19/22  Resp panel by RT-PCR (RSV, Flu A&B, Covid) Anterior Nasal Swab   Specimen: Anterior Nasal Swab  Result Value Ref Range   SARS Coronavirus 2 by RT PCR NEGATIVE NEGATIVE   Influenza A by PCR NEGATIVE NEGATIVE   Influenza B by PCR NEGATIVE NEGATIVE   Resp Syncytial Virus by PCR NEGATIVE NEGATIVE  CBC with Differential  Result Value Ref Range   WBC 6.3 4.0 - 10.5 K/uL   RBC 4.17 (L) 4.22 - 5.81 MIL/uL    Hemoglobin 12.4 (L) 13.0 - 17.0 g/dL   HCT 34.7 (L) 39.0 - 52.0 %   MCV 83.2 80.0 - 100.0 fL   MCH 29.7 26.0 - 34.0 pg   MCHC 35.7 30.0 - 36.0 g/dL   RDW 11.7 11.5 - 15.5 %   Platelets 235 150 - 400 K/uL   nRBC 0.0 0.0 - 0.2 %   Neutrophils Relative % 83 %   Neutro Abs 5.3 1.7 - 7.7 K/uL   Lymphocytes Relative 12 %   Lymphs Abs 0.7 0.7 - 4.0 K/uL   Monocytes Relative 4 %   Monocytes Absolute 0.2 0.1 - 1.0 K/uL   Eosinophils Relative 1 %   Eosinophils Absolute 0.0 0.0 - 0.5 K/uL   Basophils Relative 0 %   Basophils Absolute 0.0 0.0 - 0.1 K/uL   Immature Granulocytes 0 %   Abs Immature Granulocytes 0.01 0.00 - 0.07 K/uL   No results found.   Radiology No results found.  Procedures Procedures    Medications Ordered in ED Medications  diphenhydrAMINE (BENADRYL) injection 25 mg (has no administration in time range)  methylPREDNISolone sodium succinate (SOLU-MEDROL) 125 mg/2 mL injection 125 mg (125 mg Intravenous Given 06/19/22 0228)  famotidine (PEPCID) IVPB 20 mg premix (0 mg Intravenous Stopped 06/19/22 0324)  loratadine (CLARITIN) tablet 10 mg (10 mg Oral Given 06/19/22 0228)    ED Course/ Medical Decision Making/ A&P                             Medical Decision Making Patient with tongue swelling   Amount and/or Complexity of Data Reviewed Labs: ordered.    Details: All labs reviewed: negative covid and flu.  Normal white count 6.3, hemoglobin slight low 12.4, normal platelets  Discussion of management or test interpretation with external provider(s): Patient discussed with Dr. Lynetta Mare who has declined Patient discussed with Dr. Marcelline Deist who will see the patient in consult   Risk OTC drugs. Prescription drug management. Decision regarding hospitalization. Risk Details: Patient with angioedema from unknown source.  Has improved modestly with intervention.  Tolerating secretions.     Final Clinical Impression(s) / ED Diagnoses Final diagnoses:  None   The  patient appears reasonably stabilized for admission considering the current resources, flow, and capabilities available in the ED at this time, and I doubt any other [facility] requiring further screening and/or treatment in the ED prior to admission.  Rx / DC Orders ED Discharge Orders     None  Ifeoluwa Beller, MD 06/19/22 [phone]

## 2022-06-19 NOTE — Progress Notes (Signed)
I was called early third morning from the [facility] ER by Dr. Randal Buba.  She was concerned about angioedema.  I agreed to see at [facility] where I have privileges, and she was sending to the ER.  As of 0800, the patient had not arrived at [facility].  I called the ER at [facility] and spoke with the new attending on this morning.  He reports that the patient is much better.  They have decided not to transfer but to send home.  I never evaluated this patient but would be happy to see in the [facility] ER if problems recur.
